# Patient Record
Sex: Female | Born: 1950
Health system: Southern US, Community
[De-identification: ages and names within clinical notes are randomized; demographics above are authoritative.]

## PROBLEM LIST (undated history)

## (undated) DIAGNOSIS — D649 Anemia, unspecified: Secondary | ICD-10-CM

## (undated) DIAGNOSIS — F419 Anxiety disorder, unspecified: Secondary | ICD-10-CM

## (undated) DIAGNOSIS — C801 Malignant (primary) neoplasm, unspecified: Secondary | ICD-10-CM

## (undated) DIAGNOSIS — M1990 Unspecified osteoarthritis, unspecified site: Secondary | ICD-10-CM

## (undated) DIAGNOSIS — K219 Gastro-esophageal reflux disease without esophagitis: Secondary | ICD-10-CM

## (undated) DIAGNOSIS — Z8489 Family history of other specified conditions: Secondary | ICD-10-CM

## (undated) DIAGNOSIS — J189 Pneumonia, unspecified organism: Secondary | ICD-10-CM

## (undated) HISTORY — PX: COLONOSCOPY: SHX174

## (undated) HISTORY — PX: TONSILLECTOMY: SUR1361

## (undated) HISTORY — PX: CHOLECYSTECTOMY: SHX55

## (undated) HISTORY — PX: MOUTH SURGERY: SHX715

## (undated) HISTORY — PX: OTHER SURGICAL HISTORY: SHX169

## (undated) HISTORY — PX: APPENDECTOMY: SHX54

## (undated) HISTORY — PX: TUBAL LIGATION: SHX77

---

## 1998-05-20 ENCOUNTER — Other Ambulatory Visit: Admission: RE | Admit: 1998-05-20 | Discharge: 1998-05-20 | Payer: Self-pay | Admitting: Obstetrics and Gynecology

## 1999-11-10 ENCOUNTER — Other Ambulatory Visit: Admission: RE | Admit: 1999-11-10 | Discharge: 1999-11-10 | Payer: Self-pay | Admitting: Obstetrics and Gynecology

## 2000-12-15 ENCOUNTER — Other Ambulatory Visit: Admission: RE | Admit: 2000-12-15 | Discharge: 2000-12-15 | Payer: Self-pay | Admitting: Obstetrics and Gynecology

## 2001-12-19 ENCOUNTER — Other Ambulatory Visit: Admission: RE | Admit: 2001-12-19 | Discharge: 2001-12-19 | Payer: Self-pay | Admitting: Obstetrics and Gynecology

## 2003-01-18 ENCOUNTER — Other Ambulatory Visit: Admission: RE | Admit: 2003-01-18 | Discharge: 2003-01-18 | Payer: Self-pay | Admitting: Obstetrics and Gynecology

## 2004-01-31 ENCOUNTER — Other Ambulatory Visit: Admission: RE | Admit: 2004-01-31 | Discharge: 2004-01-31 | Payer: Self-pay | Admitting: Obstetrics and Gynecology

## 2005-02-18 ENCOUNTER — Other Ambulatory Visit: Admission: RE | Admit: 2005-02-18 | Discharge: 2005-02-18 | Payer: Self-pay | Admitting: Obstetrics and Gynecology

## 2006-03-16 ENCOUNTER — Ambulatory Visit (HOSPITAL_COMMUNITY): Admission: RE | Admit: 2006-03-16 | Discharge: 2006-03-16 | Payer: Self-pay | Admitting: General Surgery

## 2011-07-01 ENCOUNTER — Other Ambulatory Visit: Payer: Self-pay | Admitting: Obstetrics and Gynecology

## 2011-09-29 ENCOUNTER — Other Ambulatory Visit: Payer: Self-pay | Admitting: Dermatology

## 2012-07-18 ENCOUNTER — Other Ambulatory Visit: Payer: Self-pay | Admitting: Physician Assistant

## 2012-08-03 ENCOUNTER — Other Ambulatory Visit: Payer: Self-pay | Admitting: Physician Assistant

## 2013-02-21 ENCOUNTER — Other Ambulatory Visit: Payer: Self-pay | Admitting: Obstetrics and Gynecology

## 2013-06-20 ENCOUNTER — Other Ambulatory Visit: Payer: Self-pay | Admitting: Physician Assistant

## 2014-03-14 ENCOUNTER — Other Ambulatory Visit: Payer: Self-pay | Admitting: Obstetrics and Gynecology

## 2014-03-15 LAB — CYTOLOGY - PAP

## 2014-03-28 ENCOUNTER — Other Ambulatory Visit: Payer: Self-pay | Admitting: Physician Assistant

## 2014-10-10 ENCOUNTER — Other Ambulatory Visit: Payer: Self-pay | Admitting: Physician Assistant

## 2015-08-14 ENCOUNTER — Other Ambulatory Visit: Payer: Self-pay | Admitting: Obstetrics and Gynecology

## 2015-08-15 LAB — CYTOLOGY - PAP

## 2018-06-26 ENCOUNTER — Encounter (HOSPITAL_COMMUNITY): Payer: Self-pay | Admitting: *Deleted

## 2018-06-26 ENCOUNTER — Other Ambulatory Visit: Payer: Self-pay

## 2018-06-26 NOTE — Progress Notes (Signed)
Spoke with pt for pre-op call. Pt denies cardiac history, HTN or Diabetes. 

## 2018-06-27 ENCOUNTER — Observation Stay (HOSPITAL_COMMUNITY)
Admission: RE | Admit: 2018-06-27 | Discharge: 2018-06-28 | Disposition: A | Discharge status: Home / Self Care | Payer: Medicare Other | Attending: Orthopedic Surgery | Admitting: Orthopedic Surgery

## 2018-06-27 ENCOUNTER — Encounter (HOSPITAL_COMMUNITY): Payer: Self-pay | Admitting: *Deleted

## 2018-06-27 ENCOUNTER — Ambulatory Visit (HOSPITAL_COMMUNITY): Payer: Medicare Other | Admitting: Certified Registered"

## 2018-06-27 ENCOUNTER — Encounter (HOSPITAL_COMMUNITY)
Admission: RE | Disposition: A | Discharge status: Home / Self Care | Payer: Self-pay | Source: Home / Self Care | Attending: Orthopedic Surgery

## 2018-06-27 DIAGNOSIS — Z91040 Latex allergy status: Secondary | ICD-10-CM | POA: Diagnosis not present

## 2018-06-27 DIAGNOSIS — Z87891 Personal history of nicotine dependence: Secondary | ICD-10-CM | POA: Insufficient documentation

## 2018-06-27 DIAGNOSIS — Z880 Allergy status to penicillin: Secondary | ICD-10-CM | POA: Diagnosis not present

## 2018-06-27 DIAGNOSIS — F419 Anxiety disorder, unspecified: Secondary | ICD-10-CM | POA: Diagnosis not present

## 2018-06-27 DIAGNOSIS — M1990 Unspecified osteoarthritis, unspecified site: Secondary | ICD-10-CM | POA: Diagnosis not present

## 2018-06-27 DIAGNOSIS — Z791 Long term (current) use of non-steroidal anti-inflammatories (NSAID): Secondary | ICD-10-CM | POA: Insufficient documentation

## 2018-06-27 DIAGNOSIS — Z85828 Personal history of other malignant neoplasm of skin: Secondary | ICD-10-CM | POA: Insufficient documentation

## 2018-06-27 DIAGNOSIS — K219 Gastro-esophageal reflux disease without esophagitis: Secondary | ICD-10-CM | POA: Diagnosis not present

## 2018-06-27 DIAGNOSIS — S52571A Other intraarticular fracture of lower end of right radius, initial encounter for closed fracture: Secondary | ICD-10-CM | POA: Diagnosis present

## 2018-06-27 DIAGNOSIS — X58XXXA Exposure to other specified factors, initial encounter: Secondary | ICD-10-CM | POA: Diagnosis not present

## 2018-06-27 DIAGNOSIS — Z9049 Acquired absence of other specified parts of digestive tract: Secondary | ICD-10-CM | POA: Insufficient documentation

## 2018-06-27 DIAGNOSIS — S52501A Unspecified fracture of the lower end of right radius, initial encounter for closed fracture: Secondary | ICD-10-CM | POA: Diagnosis present

## 2018-06-27 DIAGNOSIS — Z882 Allergy status to sulfonamides status: Secondary | ICD-10-CM | POA: Diagnosis not present

## 2018-06-27 DIAGNOSIS — Z79899 Other long term (current) drug therapy: Secondary | ICD-10-CM | POA: Insufficient documentation

## 2018-06-27 HISTORY — DX: Anemia, unspecified: D64.9

## 2018-06-27 HISTORY — DX: Pneumonia, unspecified organism: J18.9

## 2018-06-27 HISTORY — DX: Anxiety disorder, unspecified: F41.9

## 2018-06-27 HISTORY — DX: Malignant (primary) neoplasm, unspecified: C80.1

## 2018-06-27 HISTORY — DX: Unspecified osteoarthritis, unspecified site: M19.90

## 2018-06-27 HISTORY — PX: ORIF WRIST FRACTURE: SHX2133

## 2018-06-27 HISTORY — DX: Gastro-esophageal reflux disease without esophagitis: K21.9

## 2018-06-27 LAB — CBC
HCT: 37.5 % (ref 36.0–46.0)
Hemoglobin: 12.4 g/dL (ref 12.0–15.0)
MCH: 30.9 pg (ref 26.0–34.0)
MCHC: 33.1 g/dL (ref 30.0–36.0)
MCV: 93.5 fL (ref 80.0–100.0)
NRBC: 0 % (ref 0.0–0.2)
PLATELETS: 353 10*3/uL (ref 150–400)
RBC: 4.01 MIL/uL (ref 3.87–5.11)
RDW: 12.1 % (ref 11.5–15.5)
WBC: 9.1 10*3/uL (ref 4.0–10.5)

## 2018-06-27 SURGERY — OPEN REDUCTION INTERNAL FIXATION (ORIF) WRIST FRACTURE
Anesthesia: General | Laterality: Right

## 2018-06-27 MED ORDER — LIDOCAINE 2% (20 MG/ML) 5 ML SYRINGE
INTRAMUSCULAR | Status: AC
  Filled 2018-06-27: qty 5

## 2018-06-27 MED ORDER — ONDANSETRON HCL 4 MG PO TABS: 4.0000 mg | ORAL_TABLET | Freq: Four times a day (QID) | ORAL | Status: DC | PRN

## 2018-06-27 MED ORDER — MIDAZOLAM HCL 2 MG/2ML IJ SOLN
INTRAMUSCULAR | Status: AC
  Filled 2018-06-27: qty 2

## 2018-06-27 MED ORDER — ROPIVACAINE HCL 5 MG/ML IJ SOLN
INTRAMUSCULAR | Status: DC | PRN
  Administered 2018-06-27: 15 mL via EPIDURAL

## 2018-06-27 MED ORDER — ONDANSETRON HCL 4 MG/2ML IJ SOLN
INTRAMUSCULAR | Status: AC
  Filled 2018-06-27: qty 2

## 2018-06-27 MED ORDER — LIDOCAINE HCL (CARDIAC) PF 100 MG/5ML IV SOSY
PREFILLED_SYRINGE | INTRAVENOUS | Status: DC | PRN
  Administered 2018-06-27: 80 mg via INTRAVENOUS

## 2018-06-27 MED ORDER — MIDAZOLAM HCL 2 MG/2ML IJ SOLN
INTRAMUSCULAR | Status: AC
  Administered 2018-06-27: 2 mg
  Filled 2018-06-27: qty 2

## 2018-06-27 MED ORDER — OXYCODONE HCL 5 MG PO TABS
5.0000 mg | ORAL_TABLET | ORAL | Status: DC | PRN
  Administered 2018-06-28 (×2): 10 mg via ORAL
  Filled 2018-06-27 (×2): qty 2

## 2018-06-27 MED ORDER — FENTANYL CITRATE (PF) 100 MCG/2ML IJ SOLN
INTRAMUSCULAR | Status: AC
  Administered 2018-06-27: 50 ug
  Filled 2018-06-27: qty 2

## 2018-06-27 MED ORDER — DEXAMETHASONE SODIUM PHOSPHATE 10 MG/ML IJ SOLN
INTRAMUSCULAR | Status: DC | PRN
  Administered 2018-06-27: 5 mg via INTRAVENOUS

## 2018-06-27 MED ORDER — CEFAZOLIN SODIUM-DEXTROSE 1-4 GM/50ML-% IV SOLN
1.0000 g | INTRAVENOUS | Status: AC
  Administered 2018-06-27: 1 g via INTRAVENOUS
  Filled 2018-06-27: qty 50

## 2018-06-27 MED ORDER — DEXAMETHASONE SODIUM PHOSPHATE 10 MG/ML IJ SOLN
INTRAMUSCULAR | Status: AC
  Filled 2018-06-27: qty 1

## 2018-06-27 MED ORDER — HYDROMORPHONE HCL 1 MG/ML IJ SOLN: 0.5000 mg | INTRAMUSCULAR | Status: DC | PRN

## 2018-06-27 MED ORDER — METHOCARBAMOL 500 MG PO TABS
500.0000 mg | ORAL_TABLET | Freq: Four times a day (QID) | ORAL | Status: DC | PRN
  Administered 2018-06-28: 500 mg via ORAL
  Filled 2018-06-27: qty 1

## 2018-06-27 MED ORDER — ALPRAZOLAM 0.5 MG PO TABS: 0.5000 mg | ORAL_TABLET | Freq: Four times a day (QID) | ORAL | Status: DC | PRN

## 2018-06-27 MED ORDER — VITAMIN C 500 MG PO TABS
1000.0000 mg | ORAL_TABLET | Freq: Every day | ORAL | Status: DC
  Administered 2018-06-27 – 2018-06-28 (×2): 1000 mg via ORAL
  Filled 2018-06-27 (×2): qty 2

## 2018-06-27 MED ORDER — DOCUSATE SODIUM 100 MG PO CAPS
100.0000 mg | ORAL_CAPSULE | Freq: Two times a day (BID) | ORAL | Status: DC
  Administered 2018-06-27 – 2018-06-28 (×2): 100 mg via ORAL
  Filled 2018-06-27 (×2): qty 1

## 2018-06-27 MED ORDER — PHENYLEPHRINE 40 MCG/ML (10ML) SYRINGE FOR IV PUSH (FOR BLOOD PRESSURE SUPPORT)
PREFILLED_SYRINGE | INTRAVENOUS | Status: AC
  Filled 2018-06-27: qty 10

## 2018-06-27 MED ORDER — FAMOTIDINE 20 MG PO TABS: 20.0000 mg | ORAL_TABLET | Freq: Two times a day (BID) | ORAL | Status: DC | PRN

## 2018-06-27 MED ORDER — FENTANYL CITRATE (PF) 100 MCG/2ML IJ SOLN: 25.0000 ug | INTRAMUSCULAR | Status: DC | PRN

## 2018-06-27 MED ORDER — PROPOFOL 10 MG/ML IV BOLUS
INTRAVENOUS | Status: AC
  Filled 2018-06-27: qty 20

## 2018-06-27 MED ORDER — LACTATED RINGERS IV SOLN
INTRAVENOUS | Status: DC
  Administered 2018-06-27 (×2): via INTRAVENOUS

## 2018-06-27 MED ORDER — ONDANSETRON HCL 4 MG/2ML IJ SOLN
INTRAMUSCULAR | Status: DC | PRN
  Administered 2018-06-27: 4 mg via INTRAVENOUS

## 2018-06-27 MED ORDER — MIDAZOLAM HCL 5 MG/5ML IJ SOLN
INTRAMUSCULAR | Status: DC | PRN
  Administered 2018-06-27: 2 mg via INTRAVENOUS

## 2018-06-27 MED ORDER — BUPIVACAINE HCL (PF) 0.25 % IJ SOLN
INTRAMUSCULAR | Status: AC
  Filled 2018-06-27: qty 30

## 2018-06-27 MED ORDER — CEFAZOLIN SODIUM-DEXTROSE 2-4 GM/100ML-% IV SOLN
INTRAVENOUS | Status: AC
  Filled 2018-06-27: qty 100

## 2018-06-27 MED ORDER — CEFAZOLIN SODIUM-DEXTROSE 1-4 GM/50ML-% IV SOLN
1.0000 g | Freq: Three times a day (TID) | INTRAVENOUS | Status: DC
  Administered 2018-06-28 (×2): 1 g via INTRAVENOUS
  Filled 2018-06-27 (×3): qty 50

## 2018-06-27 MED ORDER — FENTANYL CITRATE (PF) 250 MCG/5ML IJ SOLN
INTRAMUSCULAR | Status: DC | PRN
  Administered 2018-06-27: 50 ug via INTRAVENOUS

## 2018-06-27 MED ORDER — PROMETHAZINE HCL 12.5 MG RE SUPP
12.5000 mg | Freq: Four times a day (QID) | RECTAL | Status: DC | PRN
  Filled 2018-06-27: qty 1

## 2018-06-27 MED ORDER — ONDANSETRON HCL 4 MG/2ML IJ SOLN: 4.0000 mg | Freq: Four times a day (QID) | INTRAMUSCULAR | Status: DC | PRN

## 2018-06-27 MED ORDER — PHENYLEPHRINE 40 MCG/ML (10ML) SYRINGE FOR IV PUSH (FOR BLOOD PRESSURE SUPPORT)
PREFILLED_SYRINGE | INTRAVENOUS | Status: DC | PRN
  Administered 2018-06-27 (×2): 80 ug via INTRAVENOUS
  Administered 2018-06-27: 60 ug via INTRAVENOUS

## 2018-06-27 MED ORDER — METHOCARBAMOL 1000 MG/10ML IJ SOLN
500.0000 mg | Freq: Four times a day (QID) | INTRAVENOUS | Status: DC | PRN
  Filled 2018-06-27: qty 5

## 2018-06-27 MED ORDER — CEFAZOLIN SODIUM-DEXTROSE 2-4 GM/100ML-% IV SOLN
2.0000 g | Freq: Once | INTRAVENOUS | Status: AC
  Administered 2018-06-27: 2 g via INTRAVENOUS

## 2018-06-27 MED ORDER — 0.9 % SODIUM CHLORIDE (POUR BTL) OPTIME
TOPICAL | Status: DC | PRN
  Administered 2018-06-27: 1000 mL

## 2018-06-27 MED ORDER — FENTANYL CITRATE (PF) 250 MCG/5ML IJ SOLN
INTRAMUSCULAR | Status: AC
Start: 2018-06-27 — End: ?
  Filled 2018-06-27: qty 5

## 2018-06-27 MED ORDER — PROPOFOL 10 MG/ML IV BOLUS
INTRAVENOUS | Status: DC | PRN
  Administered 2018-06-27: 140 mg via INTRAVENOUS

## 2018-06-27 MED ORDER — ONDANSETRON HCL 4 MG/2ML IJ SOLN: 4.0000 mg | Freq: Once | INTRAMUSCULAR | Status: DC | PRN

## 2018-06-27 SURGICAL SUPPLY — 63 items
BANDAGE ACE 3X5.8 VEL STRL LF (GAUZE/BANDAGES/DRESSINGS) ×3 IMPLANT
BANDAGE ACE 4X5 VEL STRL LF (GAUZE/BANDAGES/DRESSINGS) ×3 IMPLANT
BIT DRILL 2.2 SS TIBIAL (BIT) ×2 IMPLANT
BLADE CLIPPER SURG (BLADE) IMPLANT
BNDG CMPR 9X4 STRL LF SNTH (GAUZE/BANDAGES/DRESSINGS) ×1
BNDG ESMARK 4X9 LF (GAUZE/BANDAGES/DRESSINGS) ×3 IMPLANT
BNDG GAUZE ELAST 4 BULKY (GAUZE/BANDAGES/DRESSINGS) ×5 IMPLANT
CANISTER SUCT 3000ML PPV (MISCELLANEOUS) ×3 IMPLANT
CORDS BIPOLAR (ELECTRODE) ×3 IMPLANT
COVER SURGICAL LIGHT HANDLE (MISCELLANEOUS) ×3 IMPLANT
COVER WAND RF STERILE (DRAPES) ×3 IMPLANT
CUFF TOURNIQUET SINGLE 18IN (TOURNIQUET CUFF) ×3 IMPLANT
CUFF TOURNIQUET SINGLE 24IN (TOURNIQUET CUFF) IMPLANT
DRAIN TLS ROUND 10FR (DRAIN) IMPLANT
DRAPE OEC MINIVIEW 54X84 (DRAPES) IMPLANT
DRAPE SURG 17X23 STRL (DRAPES) ×3 IMPLANT
DRSG ADAPTIC 3X8 NADH LF (GAUZE/BANDAGES/DRESSINGS) ×2 IMPLANT
GAUZE SPONGE 4X4 12PLY STRL (GAUZE/BANDAGES/DRESSINGS) ×3 IMPLANT
GAUZE XEROFORM 1X8 LF (GAUZE/BANDAGES/DRESSINGS) ×3 IMPLANT
GAUZE XEROFORM 5X9 LF (GAUZE/BANDAGES/DRESSINGS) ×2 IMPLANT
GOWN STRL REUS W/ TWL LRG LVL3 (GOWN DISPOSABLE) ×1 IMPLANT
GOWN STRL REUS W/ TWL XL LVL3 (GOWN DISPOSABLE) ×2 IMPLANT
GOWN STRL REUS W/TWL LRG LVL3 (GOWN DISPOSABLE) ×3
GOWN STRL REUS W/TWL XL LVL3 (GOWN DISPOSABLE) ×6
KIT BASIN OR (CUSTOM PROCEDURE TRAY) ×3 IMPLANT
KIT TURNOVER KIT B (KITS) ×3 IMPLANT
LOOP VESSEL MAXI BLUE (MISCELLANEOUS) IMPLANT
MANIFOLD NEPTUNE II (INSTRUMENTS) ×3 IMPLANT
NEEDLE 22X1 1/2 (OR ONLY) (NEEDLE) IMPLANT
NS IRRIG 1000ML POUR BTL (IV SOLUTION) ×3 IMPLANT
PACK ORTHO EXTREMITY (CUSTOM PROCEDURE TRAY) ×3 IMPLANT
PAD ARMBOARD 7.5X6 YLW CONV (MISCELLANEOUS) ×6 IMPLANT
PAD CAST 3X4 CTTN HI CHSV (CAST SUPPLIES) ×1 IMPLANT
PAD CAST 4YDX4 CTTN HI CHSV (CAST SUPPLIES) ×1 IMPLANT
PADDING CAST COTTON 3X4 STRL (CAST SUPPLIES) ×3
PADDING CAST COTTON 4X4 STRL (CAST SUPPLIES) ×3
PEG LOCKING SMOOTH 2.2X20 (Screw) ×6 IMPLANT
PEG LOCKING SMOOTH 2.2X22 (Screw) ×4 IMPLANT
PLATE MEDIUM DVR RIGHT (Plate) ×2 IMPLANT
PUTTY DBM STAGRAFT PLUS 5CC (Putty) ×2 IMPLANT
SCREW LOCK 14X2.7X 3 LD TPR (Screw) IMPLANT
SCREW LOCK 16X2.7X 3 LD TPR (Screw) IMPLANT
SCREW LOCK 20X2.7X 3 LD TPR (Screw) IMPLANT
SCREW LOCKING 2.7X13MM (Screw) ×2 IMPLANT
SCREW LOCKING 2.7X14 (Screw) ×6 IMPLANT
SCREW LOCKING 2.7X15MM (Screw) ×4 IMPLANT
SCREW LOCKING 2.7X16 (Screw) ×3 IMPLANT
SCREW LOCKING 2.7X20MM (Screw) ×3 IMPLANT
SCREW MULTI DIRECTIONAL 2.7X20 (Screw) ×2 IMPLANT
SCRUB BETADINE 4OZ XXX (MISCELLANEOUS) ×3 IMPLANT
SOL PREP POV-IOD 4OZ 10% (MISCELLANEOUS) ×3 IMPLANT
SPONGE LAP 4X18 RFD (DISPOSABLE) IMPLANT
SUT MNCRL AB 4-0 PS2 18 (SUTURE) ×3 IMPLANT
SUT PROLENE 3 0 PS 2 (SUTURE) IMPLANT
SUT VIC AB 3-0 FS2 27 (SUTURE) IMPLANT
SYR CONTROL 10ML LL (SYRINGE) IMPLANT
SYSTEM CHEST DRAIN TLS 7FR (DRAIN) ×2 IMPLANT
TOWEL OR 17X24 6PK STRL BLUE (TOWEL DISPOSABLE) ×3 IMPLANT
TOWEL OR 17X26 10 PK STRL BLUE (TOWEL DISPOSABLE) ×3 IMPLANT
TUBE CONNECTING 12'X1/4 (SUCTIONS) ×1
TUBE CONNECTING 12X1/4 (SUCTIONS) ×2 IMPLANT
TUBE EVACUATION TLS (MISCELLANEOUS) ×3 IMPLANT
WATER STERILE IRR 1000ML POUR (IV SOLUTION) ×3 IMPLANT

## 2018-06-27 NOTE — Transfer of Care (Signed)
Immediate Anesthesia Transfer of Care Note  Patient: Gloria Wise  Procedure(s) Performed: OPEN REDUCTION INTERNAL FIXATION (ORIF) WRIST FRACTURE (Right )  Patient Location: PACU  Anesthesia Type:GA combined with regional for post-op pain  Level of Consciousness: awake, alert  and oriented  Airway & Oxygen Therapy: Patient Spontanous Breathing and Patient connected to face mask oxygen  Post-op Assessment: Report given to RN and Post -op Vital signs reviewed and stable  Post vital signs: Reviewed and stable  Last Vitals:  Vitals Value Taken Time  BP 119/64 06/27/2018  6:47 PM  Temp    Pulse 96 06/27/2018  6:50 PM  Resp 17 06/27/2018  6:50 PM  SpO2 99 % 06/27/2018  6:50 PM  Vitals shown include unvalidated device data.  Last Pain:  Vitals:   06/27/18 1411  TempSrc: Oral         Complications: No apparent anesthesia complications

## 2018-06-27 NOTE — Anesthesia Procedure Notes (Signed)
Procedure Name: LMA Insertion Date/Time: 06/27/2018 5:05 PM Performed by: Teressa Lower., CRNA Pre-anesthesia Checklist: Patient identified, Emergency Drugs available, Suction available and Patient being monitored Patient Re-evaluated:Patient Re-evaluated prior to induction Oxygen Delivery Method: Circle system utilized Preoxygenation: Pre-oxygenation with 100% oxygen Induction Type: IV induction LMA: LMA inserted LMA Size: 4.0 Number of attempts: 1 Placement Confirmation: positive ETCO2 and breath sounds checked- equal and bilateral Tube secured with: Tape Dental Injury: Teeth and Oropharynx as per pre-operative assessment

## 2018-06-27 NOTE — Anesthesia Preprocedure Evaluation (Addendum)
Anesthesia Evaluation  Patient identified by MRN, date of birth, ID band Patient awake    Reviewed: Allergy & Precautions, NPO status , Patient's Chart, lab work & pertinent test results  Airway Mallampati: II  TM Distance: <3 FB Neck ROM: Full    Dental  (+) Teeth Intact, Dental Advisory Given   Pulmonary former smoker,    Pulmonary exam normal breath sounds clear to auscultation       Cardiovascular Exercise Tolerance: Good negative cardio ROS Normal cardiovascular exam Rhythm:Regular Rate:Normal     Neuro/Psych PSYCHIATRIC DISORDERS Anxiety negative neurological ROS     GI/Hepatic Neg liver ROS, GERD  Medicated,  Endo/Other  negative endocrine ROS  Renal/GU negative Renal ROS     Musculoskeletal  (+) Arthritis , Right wrist fracture   Abdominal   Peds  Hematology negative hematology ROS (+)   Anesthesia Other Findings Day of surgery medications reviewed with the patient.  Reproductive/Obstetrics                            Anesthesia Physical Anesthesia Plan  ASA: II  Anesthesia Plan: General   Post-op Pain Management:  Regional for Post-op pain   Induction: Intravenous  PONV Risk Score and Plan: 3 and Midazolam, Dexamethasone and Ondansetron  Airway Management Planned: LMA  Additional Equipment:   Intra-op Plan:   Post-operative Plan: Extubation in OR  Informed Consent: I have reviewed the patients History and Physical, chart, labs and discussed the procedure including the risks, benefits and alternatives for the proposed anesthesia with the patient or authorized representative who has indicated his/her understanding and acceptance.     Dental advisory given  Plan Discussed with: CRNA  Anesthesia Plan Comments:         Anesthesia Quick Evaluation

## 2018-06-27 NOTE — Op Note (Signed)
See dictation#[id number] Status post ORIF distal radius fracture right upper extremity  Abenezer Odonell MD

## 2018-06-27 NOTE — Anesthesia Procedure Notes (Signed)
Anesthesia Regional Block: Supraclavicular block   Pre-Anesthetic Checklist: ,, timeout performed, Correct Patient, Correct Site, Correct Laterality, Correct Procedure, Correct Position, site marked, Risks and benefits discussed,  Surgical consent,  Pre-op evaluation,  At surgeon's request and post-op pain management  Laterality: Right  Prep: chloraprep       Needles:  Injection technique: Single-shot  Needle Type: Echogenic Needle     Needle Length: 9cm  Needle Gauge: 21     Additional Needles:   Procedures:,,,, ultrasound used (permanent image in chart),,,,  Narrative:  Start time: 06/27/2018 4:21 PM End time: 06/27/2018 4:31 PM Injection made incrementally with aspirations every 5 mL.  Performed by: Personally  Anesthesiologist: Catalina Gravel, MD  Additional Notes: No pain on injection. No increased resistance to injection. Injection made in 5cc increments.  Good needle visualization.  Patient tolerated procedure well.

## 2018-06-27 NOTE — Op Note (Signed)
NAMEDARLA, MCDONALD MEDICAL RECORD QA:[id number] ACCOUNT [id number] DATE OF BIRTH:[birth year] FACILITY: [facility] LOCATION: [facility] PHYSICIAN:Shakim Faith M. Celest Reitz, MD  OPERATIVE REPORT  DATE OF PROCEDURE:  06/27/2018  PREOPERATIVE DIAGNOSIS:  Comminuted complex intraarticular distal radius fracture, right upper extremity, with marked metaphyseal comminution.  POSTOPERATIVE DIAGNOSIS:  Comminuted complex intraarticular distal radius fracture, right upper extremity, with marked metaphyseal comminution.  PROCEDURE: 1.  Open reduction internal fixation right comminuted complex distal radius fracture with 5 mL of StaGraft allograft and a DVR plate and screw construct.  This was a standard DVR plate.  Slightly extended in length. 2.  AP, lateral and oblique x-rays performed exam interpreted by myself, right wrist.  SURGEON:  Roseanne Kaufman, MD  ASSISTANT:  None.  COMPLICATIONS:  None.  ANESTHESIA:  Preoperative block with general.  TOURNIQUET TIME:  Less than an hour.  INDICATIONS:  The patient is a pleasant female who presents with above-mentioned diagnosis.  I have counseled her in regards to risks and benefits of surgery and she desires to proceed with the above-mentioned operative intervention.  All questions have  been encouraged and answered preoperatively.  The patient has marked comminution.  She understands risks and benefit profile and other issues germane to her predicament.  DESCRIPTION OF PROCEDURE:  The patient was seen by myself and anesthesia and block was placed.  Preoperative anesthesia was discussed and she underwent a general anesthetic.  She was prepped and draped with Hibiclens scrub followed by a 10-minute  surgical Betadine scrub and paint all performed by myself to prevent re-displacement or other problems.  Once this was complete, the patient then underwent a very careful and cautious approach to the extremity with timeout, followed by a volar radial  incision.  A  volar radial incision was created.  The FCR was released palmarly and dorsally followed by retraction of the carpal canal contents ulnarly and incision of the pronator quadratus.  I then packed StaGraft bone graft and reassembled the radius.   A slightly extended standard DVR right plate was placed.  This was placed without difficulty and allowed for re-creation of volar tilt, radial height and radial inclination to my satisfaction distal radioulnar joint looked excellent.  Stability looked  excellent.  Live fluoroscopy and four view stress radiography was accomplished which looked excellent.  Following this, the patient then underwent a very careful and cautious irrigation approach followed by pronator closure with Vicryl followed by  closure of the wound over a TLS drain with the tourniquet deflated and hemostasis secured with 4-0 and 3-0 Prolene.  The patient tolerated this well.  There were no complicating features.  All sponge, needle and instrument counts reported as correct.  There were no complications.  We will use a standard rehab protocol for her.  She will be spending the night for IV antibiotics, general postoperative observation and pain management.  Once  again ORIF of right distal radius without complications.  TN/NUANCE  D:06/27/2018 T:06/27/2018 JOB:[id number]

## 2018-06-27 NOTE — H&P (Signed)
Gloria Wise is an 68 y.o. female.   Chief Complaint: Right comminuted wrist fracture HPI: Patient presents for ORIF right wrist fracture.  Patient presents for evaluation and treatment of the of their upper extremity predicament. The patient denies neck, back, chest or  abdominal pain. The patient notes that they have no lower extremity problems. The patients primary complaint is noted. We are planning surgical care pathway for the upper extremity.  Past Medical History:  Diagnosis Date  . Anemia    after first child  . Anxiety   . Arthritis   . Cancer ([place])    Basal cell on face  . GERD (gastroesophageal reflux disease)   . Pneumonia     Past Surgical History:  Procedure Laterality Date  . APPENDECTOMY    . CHOLECYSTECTOMY    . COLONOSCOPY    . hemorroid banding    . MOUTH SURGERY    . TONSILLECTOMY    . TUBAL LIGATION      History reviewed. No pertinent family history. Social History:  reports that she has quit smoking. She has never used smokeless tobacco. She reports current alcohol use. She reports that she does not use drugs.  Allergies:  Allergies  Allergen Reactions  . Latex Other (See Comments)    Skin irritation   . Penicillins Diarrhea    Upset stomach   . Sulfa Antibiotics Rash    All over    Medications Prior to Admission  Medication Sig Dispense Refill  . cholecalciferol (VITAMIN D3) 25 MCG (1000 UT) tablet Take 1,000 Units by mouth daily.    Marland Kitchen HYDROcodone-acetaminophen (NORCO/VICODIN) 5-325 MG tablet Take 1 tablet by mouth every 6 (six) hours as needed for moderate pain.    Marland Kitchen ibuprofen (ADVIL,MOTRIN) 200 MG tablet Take 400 mg by mouth every 8 (eight) hours as needed (pain).    Marland Kitchen loratadine (CLARITIN) 10 MG tablet Take 10 mg by mouth daily as needed for allergies.    . pseudoephedrine (SUDAFED) 30 MG tablet Take 30 mg by mouth every 4 (four) hours as needed for congestion.    . Psyllium Husk POWD Take 5 mLs by mouth daily.      Results for orders  placed or performed during the hospital encounter of 06/27/18 (from the past 48 hour(s))  CBC     Status: None   Collection Time: 06/27/18  2:34 PM  Result Value Ref Range   WBC 9.1 4.0 - 10.5 K/uL   RBC 4.01 3.87 - 5.11 MIL/uL   Hemoglobin 12.4 12.0 - 15.0 g/dL   HCT 37.5 36.0 - 46.0 %   MCV 93.5 80.0 - 100.0 fL   MCH 30.9 26.0 - 34.0 pg   MCHC 33.1 30.0 - 36.0 g/dL   RDW 12.1 11.5 - 15.5 %   Platelets 353 150 - 400 K/uL   nRBC 0.0 0.0 - 0.2 %    Comment: Performed at [facility], [place] [address] [place], [place] [address]   No results found.  Review of Systems  Respiratory: Negative.   Cardiovascular: Negative.   Gastrointestinal: Negative.   Genitourinary: Negative.     Blood pressure 135/73, pulse 84, temperature 99.2 F (37.3 C), temperature source Oral, resp. rate 18, height 5' 7.5" (1.715 m), weight 78.9 kg, SpO2 97 %. Physical Exam  Comminuted complex and articular right distal radius fracture with displacement.  Patient will proceed with ORIF  The patient is alert and oriented in no acute distress. The patient complains of  pain in the affected upper extremity.  The patient is noted to have a normal HEENT exam. Lung fields show equal chest expansion and no shortness of breath. Abdomen exam is nontender without distention. Lower extremity examination does not show any fracture dislocation or blood clot symptoms. Pelvis is stable and the neck and back are stable and nontender. Assessment/Plan We are planning surgery for your upper extremity. The risk and benefits of surgery to include risk of bleeding, infection, anesthesia,  damage to normal structures and failure of the surgery to accomplish its intended goals of relieving symptoms and restoring function have been discussed in detail. With this in mind we plan to proceed. I have specifically discussed with the patient the pre-and postoperative regime and the dos and don'ts and risk and benefits in great detail.  Risk and benefits of surgery also include risk of dystrophy(CRPS), chronic nerve pain, failure of the healing process to go onto completion and other inherent risks of surgery The relavent the pathophysiology of the disease/injury process, as well as the alternatives for treatment and postoperative course of action has been discussed in great detail with the patient who desires to proceed.  We will do everything in our power to help you (the patient) restore function to the upper extremity. It is a pleasure to see this patient today.   Willa Frater III, MD 06/27/2018, 6:46 PM

## 2018-06-28 ENCOUNTER — Other Ambulatory Visit: Payer: Self-pay

## 2018-06-28 DIAGNOSIS — S52571A Other intraarticular fracture of lower end of right radius, initial encounter for closed fracture: Secondary | ICD-10-CM | POA: Diagnosis not present

## 2018-06-28 NOTE — Progress Notes (Signed)
AVS given and reviewed with pt. Medications discussed and reviewed. All questions answered to satisfaction. Pt to be escorted off the unit via wheelchair by volunteer services.

## 2018-06-28 NOTE — Plan of Care (Signed)
  Problem: Clinical Measurements: Goal: Postoperative complications will be avoided or minimized Outcome: Progressing   Problem: Skin Integrity: Goal: Demonstration of wound healing without infection will improve Outcome: Progressing   

## 2018-06-28 NOTE — Progress Notes (Signed)
Patient ID: Gloria Wise, female   DOB: 1950/12/01, 68 y.o.   MRN: [id number] Stable on exam.  Patient is in good spirits.  Block is still in place.  The patient and I discussed all issues at length.  There is no neurovascular deficit.  We will await the block to dissipate.  Will discharge after lunch.  She will expect to have a significant degree of pain and we discussed this.  I will phone her in pain medicine to her pharmacy.  It is been a pleasure to see and treat her.  We will see her in 12 to 16 days.  All questions have been addressed.  Patient has been seen and examined. Patient has pain appropriate to his injury/process. Patient denies new complaints at this present time. I have discussed the care pathway with nursing staff. Patient is appropriate and alert.  We reviewed vital signs and intake output which are stable.  The upper extremity is neurovascularly intact. Refill is normal. There is no signs of compartment syndrome. There is no signs of dystrophy. There is normal sensation.  I have spent a  great deal of time discussing range of motion edema control and other techniques to decrease edema and promote flexion extension of the fingers. Patient understands the importance of elevation range of motion massage and other measures to lessen pain and prevent swelling.  We have also discussed immobilization to appropriate areas involved.  We have discussed with the patient shoulder range of motion to prevent adhesive capsulitis.  The remainder of the examination is normal today without complicating feature.  Drain was removed without difficulty  Patient will be discharged home. Will plan to see the patient back in the office as per discharge instructions (please see discharge instructions).  Patient had an uneventful hospital course. At the time of discharge patient is stable awake alert and oriented in no acute distress. Regular diet will be continued and has been tolerated. Patient will  notify should have problems occur. There is no signs of DVT infection or other complication at this juncture.  All questions have been incurred and answered.  Please see discharge med list  Legacy Carrender MD

## 2018-06-28 NOTE — Discharge Instructions (Signed)

## 2018-06-28 NOTE — Progress Notes (Signed)
PT Cancellation Note  Patient Details Name: Gloria Wise MRN: [id number] DOB: 06-Jun-1950   Cancelled Treatment:    Reason Eval/Treat Not Completed: PT screened, no needs identified, will sign off; attempted to see pt, OT in room and reports patient mobilizing well and no need for skilled PT at this time.  Patient related events of her injury and verbalized plans for slower movements and more careful thought to activities.  Will sign off as per pt/OT no needs.   Reginia Naas 06/28/2018, 9:58 AM  Magda Kiel, [place] [phone] 06/28/2018

## 2018-06-28 NOTE — Evaluation (Signed)
Occupational Therapy Evaluation and Discharge Patient Details Name: Gloria Wise MRN: [id number] DOB: [birth year] Today's Date: 06/28/2018    History of Present Illness Pt is a 68 y/o female s/p ORIF R wrist fracture. Pt has a PMH including Anemia, Anxiety, Arthritis, Cancer, and Pneumonia.   Clinical Impression   Pt seen for education of edema management, education with safety and compensatory strategies for dressing/bathing IADL. Pt able to perform transfers at independent level, ambulation without LOB or dizziness/light headedness. Pt required LOTS of reinforcement of education due to anxiety. Provided encouragement. Pt plans on largely wearing a bathrobe at home, but educated to dress RUE first, has a specialized bag for bathing and plans on having supervision. Education complete. OT to sign off at this time.     Follow Up Recommendations  Follow surgeon's recommendation for DC plan and follow-up therapies    Equipment Recommendations  None recommended by OT    Recommendations for Other Services       Precautions / Restrictions Precautions Required Braces or Orthoses: Splint/Cast Splint/Cast: R wrist Restrictions Weight Bearing Restrictions: Yes RUE Weight Bearing: Non weight bearing      Mobility Bed Mobility Overal bed mobility: Independent                Transfers Overall transfer level: Independent Equipment used: None                  Balance Overall balance assessment: No apparent balance deficits (not formally assessed)                                         ADL either performed or assessed with clinical judgement   ADL Overall ADL's : Modified independent                                       General ADL Comments: educated on sequencing for dressing, safety and compensatory strategies for bathing, smart clothing choices     Vision Baseline Vision/History: Wears glasses Wears Glasses: At all times Patient  Visual Report: No change from baseline       Perception     Praxis      Pertinent Vitals/Pain Pain Assessment: Faces Faces Pain Scale: Hurts a little bit Pain Location: R wrist Pain Descriptors / Indicators: Discomfort Pain Intervention(s): Monitored during session;Repositioned;Other (comment)(elevation, exercises,)     Hand Dominance Right   Extremity/Trunk Assessment Upper Extremity Assessment Upper Extremity Assessment: RUE deficits/detail RUE Deficits / Details: cast from wrist to elbow, elbow and shoulder ROM WFL RUE Sensation: decreased light touch(dull) RUE Coordination: decreased fine motor   Lower Extremity Assessment Lower Extremity Assessment: Overall WFL for tasks assessed   Cervical / Trunk Assessment Cervical / Trunk Assessment: Normal   Communication Communication Communication: No difficulties   Cognition Arousal/Alertness: Awake/alert Behavior During Therapy: Anxious;WFL for tasks assessed/performed Overall Cognitive Status: Within Functional Limits for tasks assessed                                     General Comments       Exercises Exercises: Other exercises Other Exercises Other Exercises: shoulder FF and abduction, horizontal adduction Other Exercises: digit flexion/extension as cast allows Other Exercises: thumb movement as cast  allows Other Exercises: elbow fexion/extension   Shoulder Instructions      Home Living Family/patient expects to be discharged to:: Private residence Living Arrangements: Alone Available Help at Discharge: Family;Friend(s);Available 24 hours/day Type of Home: House                       Home Equipment: Other (comment)(sling)          Prior Functioning/Environment Level of Independence: Independent        Comments: drives, assists with grandchildren, loves bridge        OT Problem List: Pain;Impaired UE functional use;Impaired sensation      OT Treatment/Interventions:       OT Goals(Current goals can be found in the care plan section) Acute Rehab OT Goals Patient Stated Goal: get back to playing bridge and watching grandkids OT Goal Formulation: With patient Time For Goal Achievement: 07/12/18 Potential to Achieve Goals: Good  OT Frequency:     Barriers to D/C:            Co-evaluation              AM-PAC OT "6 Clicks" Daily Activity     Outcome Measure Help from another person eating meals?: A Little Help from another person taking care of personal grooming?: A Little Help from another person toileting, which includes using toliet, bedpan, or urinal?: None Help from another person bathing (including washing, rinsing, drying)?: A Little Help from another person to put on and taking off regular upper body clothing?: A Little Help from another person to put on and taking off regular lower body clothing?: A Little 6 Click Score: 19   End of Session Equipment Utilized During Treatment: Gait belt Nurse Communication: Mobility status  Activity Tolerance: Patient tolerated treatment well Patient left: in chair;with call bell/phone within reach  OT Visit Diagnosis: Pain Pain - Right/Left: Right Pain - part of body: Arm                Time: 1155-2080 OT Time Calculation (min): 52 min Charges:  OT General Charges $OT Visit: 1 Visit OT Evaluation $OT Eval Low Complexity: [place] OTR/L Acute Rehabilitation Services Pager: [phone] Office: [place] 06/28/2018, 10:49 AM

## 2018-06-28 NOTE — Discharge Summary (Signed)
Physician Discharge Summary  Patient ID: Gloria Wise MRN: [id number] DOB/AGE: Sep 23, 1950 68 y.o.  Admit date: 06/27/2018 Discharge date:   Admission Diagnoses: Right wrist fracture Past Medical History:  Diagnosis Date  . Anemia    after first child  . Anxiety   . Arthritis   . Cancer ([place])    Basal cell on face  . GERD (gastroesophageal reflux disease)   . Pneumonia     Discharge Diagnoses:  Active Problems:   Closed fracture of right distal radius   Surgeries: Procedure(s): OPEN REDUCTION INTERNAL FIXATION (ORIF) WRIST FRACTURE on 06/27/2018    Consultants:   Discharged Condition: Improved  Hospital Course: REENA BORROMEO is an 68 y.o. female who was admitted 06/27/2018 with a chief complaint of No chief complaint on file. , and found to have a diagnosis of Right wrist fracture.  They were brought to the operating room on 06/27/2018 and underwent Procedure(s): OPEN REDUCTION INTERNAL FIXATION (ORIF) WRIST FRACTURE.    They were given perioperative antibiotics:  Anti-infectives (From admission, onward)   Start     Dose/Rate Route Frequency Ordered Stop   06/28/18 0400  ceFAZolin (ANCEF) IVPB 1 g/50 mL premix     1 g 100 mL/hr over 30 Minutes Intravenous Every 8 hours 06/27/18 1951     06/27/18 2000  ceFAZolin (ANCEF) IVPB 1 g/50 mL premix     1 g 100 mL/hr over 30 Minutes Intravenous NOW 06/27/18 1951 06/27/18 2253   06/27/18 1416  ceFAZolin (ANCEF) 2-4 GM/100ML-% IVPB    Note to Pharmacy:  Merryl Hacker   : cabinet override      06/27/18 1416 06/27/18 1710   06/27/18 1415  ceFAZolin (ANCEF) IVPB 2g/100 mL premix     2 g 200 mL/hr over 30 Minutes Intravenous  Once 06/27/18 1409 06/27/18 1710    .  They were given sequential compression devices, early ambulation, and Other (comment) for DVT prophylaxis.  Recent vital signs:  Patient Vitals for the past 24 hrs:  BP Temp Temp src Pulse Resp SpO2 Height Weight  06/28/18 0409 119/67 98.6 F (37 C) - 88 16 94 % - -   06/28/18 0006 112/62 98.6 F (37 C) Oral 88 18 94 % - -  06/27/18 1948 119/78 98.2 F (36.8 C) Oral 86 19 95 % - -  06/27/18 1936 - (!) 97 F (36.1 C) - 78 17 94 % - -  06/27/18 1932 133/69 - - - - - - -  06/27/18 1928 - - - 78 15 94 % - -  06/27/18 1917 126/70 - - - - - - -  06/27/18 1905 139/78 - - 91 16 98 % - -  06/27/18 1854 - - - 98 12 99 % - -  06/27/18 1851 - 97.8 F (36.6 C) - 97 - - - -  06/27/18 1847 119/64 - - - - - - -  06/27/18 1411 135/73 99.2 F (37.3 C) Oral 84 18 97 % 5' 7.5" (1.715 m) 78.9 kg  .  Recent laboratory studies: No results found.  Discharge Medications:   Allergies as of 06/28/2018      Reactions   Latex Other (See Comments)   Skin irritation    Penicillins Diarrhea   Upset stomach    Sulfa Antibiotics Rash   All over      Medication List    TAKE these medications   cholecalciferol 25 MCG (1000 UT) tablet Commonly known as:  VITAMIN D3 Take  1,000 Units by mouth daily.   HYDROcodone-acetaminophen 5-325 MG tablet Commonly known as:  NORCO/VICODIN Take 1 tablet by mouth every 6 (six) hours as needed for moderate pain.   ibuprofen 200 MG tablet Commonly known as:  ADVIL,MOTRIN Take 400 mg by mouth every 8 (eight) hours as needed (pain).   loratadine 10 MG tablet Commonly known as:  CLARITIN Take 10 mg by mouth daily as needed for allergies.   pseudoephedrine 30 MG tablet Commonly known as:  SUDAFED Take 30 mg by mouth every 4 (four) hours as needed for congestion.   Psyllium Husk Powd Take 5 mLs by mouth daily.       Diagnostic Studies: No results found.  They benefited maximally from their hospital stay and there were no complications.     Disposition: Discharge disposition: 01-Home or Self Care      Discharge Instructions    Call MD / Call 911   Complete by:  As directed    If you experience chest pain or shortness of breath, CALL 911 and be transported to the hospital emergency room.  If you develope a fever above  101 F, pus (white drainage) or increased drainage or redness at the wound, or calf pain, call your surgeon's office.   Constipation Prevention   Complete by:  As directed    Drink plenty of fluids.  Prune juice may be helpful.  You may use a stool softener, such as Colace (over the counter) 100 mg twice a day.  Use MiraLax (over the counter) for constipation as needed.   Diet - low sodium heart healthy   Complete by:  As directed    Increase activity slowly as tolerated   Complete by:  As directed      Follow-up Information    Roseanne Kaufman, MD Follow up in 14 day(s).   Specialty:  Orthopedic Surgery Why:  We will call you for a follow-up appointment in 14 days Contact information: [address] [place] [address] [phone]          Patient has been seen and examined. Patient has pain appropriate to his injury/process. Patient denies new complaints at this present time. I have discussed the care pathway with nursing staff. Patient is appropriate and alert.  We reviewed vital signs and intake output which are stable.  The upper extremity is neurovascularly intact. Refill is normal. There is no signs of compartment syndrome. There is no signs of dystrophy. There is normal sensation.  I have spent a  great deal of time discussing range of motion edema control and other techniques to decrease edema and promote flexion extension of the fingers. Patient understands the importance of elevation range of motion massage and other measures to lessen pain and prevent swelling.  We have also discussed immobilization to appropriate areas involved.  We have discussed with the patient shoulder range of motion to prevent adhesive capsulitis.  The remainder of the examination is normal today without complicating feature.  Drain was removed without difficulty  Patient will be discharged home. Will plan to see the patient back in the office as per discharge instructions  (please see discharge instructions).  Patient had an uneventful hospital course. At the time of discharge patient is stable awake alert and oriented in no acute distress. Regular diet will be continued and has been tolerated. Patient will notify should have problems occur. There is no signs of DVT infection or other complication at this juncture.  All questions have  been incurred and answered.  Please see discharge med list Final diagnosis status post open reduction internal fixation greater than 5 part distal radius fracture right upper extremity Shantika Bermea MD  Signed: Satira Anis Nickolette Espinola III 06/28/2018, 4:44 AM

## 2018-06-29 ENCOUNTER — Encounter (HOSPITAL_COMMUNITY): Payer: Self-pay | Admitting: Orthopedic Surgery

## 2018-06-29 NOTE — Anesthesia Postprocedure Evaluation (Signed)
Anesthesia Post Note  Patient: Gloria Wise  Procedure(s) Performed: OPEN REDUCTION INTERNAL FIXATION (ORIF) WRIST FRACTURE (Right )     Patient location during evaluation: PACU Anesthesia Type: General and Regional Level of consciousness: awake and alert Pain management: pain level controlled Vital Signs Assessment: post-procedure vital signs reviewed and stable Respiratory status: spontaneous breathing, nonlabored ventilation, respiratory function stable and patient connected to nasal cannula oxygen Cardiovascular status: blood pressure returned to baseline and stable Postop Assessment: no apparent nausea or vomiting Anesthetic complications: no    Last Vitals:  Vitals:   06/28/18 0915 06/28/18 1200  BP: (!) 132/49 (!) 101/53  Pulse: 88 74  Resp:    Temp: 36.9 C 36.8 C  SpO2: 98% 99%    Last Pain:  Vitals:   06/28/18 1427  TempSrc:   PainSc: 3    Pain Goal: Patients Stated Pain Goal: 3 (06/28/18 1326)                 [place]

## 2019-05-28 DIAGNOSIS — Z0001 Encounter for general adult medical examination with abnormal findings: Secondary | ICD-10-CM | POA: Diagnosis not present

## 2019-05-28 DIAGNOSIS — Z1159 Encounter for screening for other viral diseases: Secondary | ICD-10-CM | POA: Diagnosis not present

## 2019-05-31 DIAGNOSIS — Z0000 Encounter for general adult medical examination without abnormal findings: Secondary | ICD-10-CM | POA: Diagnosis not present

## 2019-05-31 DIAGNOSIS — E559 Vitamin D deficiency, unspecified: Secondary | ICD-10-CM | POA: Diagnosis not present

## 2019-05-31 DIAGNOSIS — J309 Allergic rhinitis, unspecified: Secondary | ICD-10-CM | POA: Diagnosis not present

## 2019-05-31 DIAGNOSIS — R3129 Other microscopic hematuria: Secondary | ICD-10-CM | POA: Diagnosis not present

## 2019-05-31 DIAGNOSIS — Z0001 Encounter for general adult medical examination with abnormal findings: Secondary | ICD-10-CM | POA: Diagnosis not present

## 2019-05-31 DIAGNOSIS — M8580 Other specified disorders of bone density and structure, unspecified site: Secondary | ICD-10-CM | POA: Diagnosis not present

## 2019-06-05 DIAGNOSIS — S6992XA Unspecified injury of left wrist, hand and finger(s), initial encounter: Secondary | ICD-10-CM | POA: Diagnosis not present

## 2019-06-05 DIAGNOSIS — W1839XA Other fall on same level, initial encounter: Secondary | ICD-10-CM | POA: Diagnosis not present

## 2019-06-05 DIAGNOSIS — Z882 Allergy status to sulfonamides status: Secondary | ICD-10-CM | POA: Diagnosis not present

## 2019-06-05 DIAGNOSIS — S82041A Displaced comminuted fracture of right patella, initial encounter for closed fracture: Secondary | ICD-10-CM | POA: Diagnosis not present

## 2019-06-05 DIAGNOSIS — S01412A Laceration without foreign body of left cheek and temporomandibular area, initial encounter: Secondary | ICD-10-CM | POA: Diagnosis not present

## 2019-06-05 DIAGNOSIS — Z880 Allergy status to penicillin: Secondary | ICD-10-CM | POA: Diagnosis not present

## 2019-06-06 DIAGNOSIS — S82001A Unspecified fracture of right patella, initial encounter for closed fracture: Secondary | ICD-10-CM | POA: Diagnosis not present

## 2019-06-07 ENCOUNTER — Encounter (HOSPITAL_COMMUNITY): Payer: Self-pay | Admitting: Orthopedic Surgery

## 2019-06-07 ENCOUNTER — Other Ambulatory Visit (HOSPITAL_COMMUNITY)
Admission: RE | Admit: 2019-06-07 | Discharge: 2019-06-07 | Disposition: A | Discharge status: Home / Self Care | Payer: Medicare PPO | Source: Ambulatory Visit | Attending: Orthopedic Surgery | Admitting: Orthopedic Surgery

## 2019-06-07 ENCOUNTER — Other Ambulatory Visit: Payer: Self-pay

## 2019-06-07 DIAGNOSIS — Z20822 Contact with and (suspected) exposure to covid-19: Secondary | ICD-10-CM | POA: Diagnosis not present

## 2019-06-07 DIAGNOSIS — Z01812 Encounter for preprocedural laboratory examination: Secondary | ICD-10-CM | POA: Diagnosis not present

## 2019-06-07 LAB — SARS CORONAVIRUS 2 (TAT 6-24 HRS): SARS Coronavirus 2: NEGATIVE

## 2019-06-07 NOTE — Progress Notes (Signed)
Spoke with pt for pre-op call. Pt denies cardiac history, HTN or Diabetes. Pt's PCP is Dr. Shelia Media.  Pt had her Covid test done today and is in quarantine. Pt understands to stay in it until she arrives to the hospital. Instructed her to have her son who will be driving her here to wear his mask and she wear her mask on the way to the hospital.  Pt will have another son come by and pick up her Pre-surgery ensure drink this evening.

## 2019-06-08 ENCOUNTER — Encounter (HOSPITAL_COMMUNITY): Payer: Self-pay | Admitting: Orthopedic Surgery

## 2019-06-08 ENCOUNTER — Ambulatory Visit (HOSPITAL_COMMUNITY): Payer: Medicare PPO | Admitting: Anesthesiology

## 2019-06-08 ENCOUNTER — Ambulatory Visit (HOSPITAL_COMMUNITY)
Admission: RE | Admit: 2019-06-08 | Discharge: 2019-06-08 | Disposition: A | Discharge status: Home / Self Care | Payer: Medicare PPO | Attending: Orthopedic Surgery | Admitting: Orthopedic Surgery

## 2019-06-08 ENCOUNTER — Encounter (HOSPITAL_COMMUNITY)
Admission: RE | Disposition: A | Discharge status: Home / Self Care | Payer: Self-pay | Source: Home / Self Care | Attending: Orthopedic Surgery

## 2019-06-08 ENCOUNTER — Ambulatory Visit (HOSPITAL_COMMUNITY): Payer: Medicare PPO

## 2019-06-08 DIAGNOSIS — Z881 Allergy status to other antibiotic agents status: Secondary | ICD-10-CM | POA: Diagnosis not present

## 2019-06-08 DIAGNOSIS — M1990 Unspecified osteoarthritis, unspecified site: Secondary | ICD-10-CM | POA: Insufficient documentation

## 2019-06-08 DIAGNOSIS — Z882 Allergy status to sulfonamides status: Secondary | ICD-10-CM | POA: Insufficient documentation

## 2019-06-08 DIAGNOSIS — S82041A Displaced comminuted fracture of right patella, initial encounter for closed fracture: Secondary | ICD-10-CM | POA: Diagnosis not present

## 2019-06-08 DIAGNOSIS — G8918 Other acute postprocedural pain: Secondary | ICD-10-CM | POA: Diagnosis not present

## 2019-06-08 DIAGNOSIS — Z880 Allergy status to penicillin: Secondary | ICD-10-CM | POA: Diagnosis not present

## 2019-06-08 DIAGNOSIS — Z85828 Personal history of other malignant neoplasm of skin: Secondary | ICD-10-CM | POA: Insufficient documentation

## 2019-06-08 DIAGNOSIS — Z79899 Other long term (current) drug therapy: Secondary | ICD-10-CM | POA: Diagnosis not present

## 2019-06-08 DIAGNOSIS — F419 Anxiety disorder, unspecified: Secondary | ICD-10-CM | POA: Diagnosis not present

## 2019-06-08 DIAGNOSIS — Z91040 Latex allergy status: Secondary | ICD-10-CM | POA: Insufficient documentation

## 2019-06-08 DIAGNOSIS — Z87891 Personal history of nicotine dependence: Secondary | ICD-10-CM | POA: Diagnosis not present

## 2019-06-08 DIAGNOSIS — S82031A Displaced transverse fracture of right patella, initial encounter for closed fracture: Secondary | ICD-10-CM | POA: Diagnosis not present

## 2019-06-08 DIAGNOSIS — W19XXXA Unspecified fall, initial encounter: Secondary | ICD-10-CM | POA: Diagnosis not present

## 2019-06-08 DIAGNOSIS — K219 Gastro-esophageal reflux disease without esophagitis: Secondary | ICD-10-CM | POA: Insufficient documentation

## 2019-06-08 DIAGNOSIS — T148XXA Other injury of unspecified body region, initial encounter: Secondary | ICD-10-CM

## 2019-06-08 DIAGNOSIS — S82001A Unspecified fracture of right patella, initial encounter for closed fracture: Secondary | ICD-10-CM | POA: Diagnosis not present

## 2019-06-08 HISTORY — DX: Family history of other specified conditions: Z84.89

## 2019-06-08 HISTORY — PX: ORIF PATELLA: SHX5033

## 2019-06-08 LAB — CBC
HCT: 42.5 % (ref 36.0–46.0)
Hemoglobin: 14 g/dL (ref 12.0–15.0)
MCH: 31.5 pg (ref 26.0–34.0)
MCHC: 32.9 g/dL (ref 30.0–36.0)
MCV: 95.7 fL (ref 80.0–100.0)
Platelets: 340 10*3/uL (ref 150–400)
RBC: 4.44 MIL/uL (ref 3.87–5.11)
RDW: 12.1 % (ref 11.5–15.5)
WBC: 10.5 10*3/uL (ref 4.0–10.5)
nRBC: 0 % (ref 0.0–0.2)

## 2019-06-08 SURGERY — OPEN REDUCTION INTERNAL FIXATION (ORIF) PATELLA
Anesthesia: General | Site: Knee | Laterality: Right

## 2019-06-08 MED ORDER — LACTATED RINGERS IV SOLN: INTRAVENOUS | Status: DC

## 2019-06-08 MED ORDER — MEPERIDINE HCL 25 MG/ML IJ SOLN
6.2500 mg | INTRAMUSCULAR | Status: DC | PRN
  Administered 2019-06-08: 12.5 mg via INTRAVENOUS

## 2019-06-08 MED ORDER — MIDAZOLAM HCL 2 MG/2ML IJ SOLN
2.0000 mg | Freq: Once | INTRAMUSCULAR | Status: AC
  Filled 2019-06-08: qty 2

## 2019-06-08 MED ORDER — LIDOCAINE 2% (20 MG/ML) 5 ML SYRINGE
INTRAMUSCULAR | Status: DC | PRN
  Administered 2019-06-08: 40 mg via INTRAVENOUS

## 2019-06-08 MED ORDER — CHLORHEXIDINE GLUCONATE 4 % EX LIQD: 60.0000 mL | Freq: Once | CUTANEOUS | Status: DC

## 2019-06-08 MED ORDER — MIDAZOLAM HCL 2 MG/2ML IJ SOLN
INTRAMUSCULAR | Status: AC
  Administered 2019-06-08: 2 mg via INTRAVENOUS
  Filled 2019-06-08: qty 2

## 2019-06-08 MED ORDER — PHENYLEPHRINE 40 MCG/ML (10ML) SYRINGE FOR IV PUSH (FOR BLOOD PRESSURE SUPPORT)
PREFILLED_SYRINGE | INTRAVENOUS | Status: AC
  Filled 2019-06-08: qty 20

## 2019-06-08 MED ORDER — ONDANSETRON 4 MG PO TBDP: 4.0000 mg | ORAL_TABLET | Freq: Three times a day (TID) | ORAL | 0 refills | Status: DC | PRN

## 2019-06-08 MED ORDER — ACETAMINOPHEN 325 MG PO TABS: 325.0000 mg | ORAL_TABLET | ORAL | Status: DC | PRN

## 2019-06-08 MED ORDER — FENTANYL CITRATE (PF) 100 MCG/2ML IJ SOLN
INTRAMUSCULAR | Status: AC
  Administered 2019-06-08: 14:00:00 100 ug via INTRAVENOUS
  Filled 2019-06-08: qty 2

## 2019-06-08 MED ORDER — DEXAMETHASONE SODIUM PHOSPHATE 10 MG/ML IJ SOLN
INTRAMUSCULAR | Status: AC
  Filled 2019-06-08: qty 1

## 2019-06-08 MED ORDER — BUPIVACAINE LIPOSOME 1.3 % IJ SUSP
INTRAMUSCULAR | Status: DC | PRN
  Administered 2019-06-08: 10 mL via PERINEURAL

## 2019-06-08 MED ORDER — SENNOSIDES-DOCUSATE SODIUM 8.6-50 MG PO TABS: 1.0000 | ORAL_TABLET | Freq: Every day | ORAL | 1 refills | Status: AC | PRN

## 2019-06-08 MED ORDER — 0.9 % SODIUM CHLORIDE (POUR BTL) OPTIME
TOPICAL | Status: DC | PRN
  Administered 2019-06-08: 16:00:00 1000 mL

## 2019-06-08 MED ORDER — DEXAMETHASONE SODIUM PHOSPHATE 10 MG/ML IJ SOLN
INTRAMUSCULAR | Status: DC | PRN
  Administered 2019-06-08: 4 mg via INTRAVENOUS

## 2019-06-08 MED ORDER — FENTANYL CITRATE (PF) 100 MCG/2ML IJ SOLN: 25.0000 ug | INTRAMUSCULAR | Status: DC | PRN

## 2019-06-08 MED ORDER — LIDOCAINE 2% (20 MG/ML) 5 ML SYRINGE
INTRAMUSCULAR | Status: AC
  Filled 2019-06-08: qty 10

## 2019-06-08 MED ORDER — FENTANYL CITRATE (PF) 250 MCG/5ML IJ SOLN
INTRAMUSCULAR | Status: AC
  Filled 2019-06-08: qty 5

## 2019-06-08 MED ORDER — PROPOFOL 10 MG/ML IV BOLUS
INTRAVENOUS | Status: DC | PRN
  Administered 2019-06-08: 140 mg via INTRAVENOUS

## 2019-06-08 MED ORDER — BUPIVACAINE-EPINEPHRINE (PF) 0.25% -1:200000 IJ SOLN
INTRAMUSCULAR | Status: DC | PRN
  Administered 2019-06-08: 30 mL

## 2019-06-08 MED ORDER — CEFAZOLIN SODIUM-DEXTROSE 2-4 GM/100ML-% IV SOLN
INTRAVENOUS | Status: AC
  Filled 2019-06-08: qty 100

## 2019-06-08 MED ORDER — FENTANYL CITRATE (PF) 250 MCG/5ML IJ SOLN
INTRAMUSCULAR | Status: DC | PRN
  Administered 2019-06-08: 50 ug via INTRAVENOUS

## 2019-06-08 MED ORDER — FENTANYL CITRATE (PF) 100 MCG/2ML IJ SOLN
100.0000 ug | Freq: Once | INTRAMUSCULAR | Status: AC
  Filled 2019-06-08: qty 2

## 2019-06-08 MED ORDER — ONDANSETRON HCL 4 MG/2ML IJ SOLN
INTRAMUSCULAR | Status: DC | PRN
  Administered 2019-06-08: 4 mg via INTRAVENOUS

## 2019-06-08 MED ORDER — SUCCINYLCHOLINE CHLORIDE 200 MG/10ML IV SOSY
PREFILLED_SYRINGE | INTRAVENOUS | Status: AC
  Filled 2019-06-08: qty 10

## 2019-06-08 MED ORDER — CEFAZOLIN SODIUM-DEXTROSE 2-4 GM/100ML-% IV SOLN
2.0000 g | INTRAVENOUS | Status: AC
  Administered 2019-06-08: 16:00:00 2 g via INTRAVENOUS

## 2019-06-08 MED ORDER — OXYCODONE HCL 5 MG/5ML PO SOLN: 5.0000 mg | Freq: Once | ORAL | Status: DC | PRN

## 2019-06-08 MED ORDER — MEPERIDINE HCL 25 MG/ML IJ SOLN
INTRAMUSCULAR | Status: AC
  Filled 2019-06-08: qty 1

## 2019-06-08 MED ORDER — MIDAZOLAM HCL 5 MG/5ML IJ SOLN
INTRAMUSCULAR | Status: DC | PRN
  Administered 2019-06-08 (×2): 1 mg via INTRAVENOUS

## 2019-06-08 MED ORDER — OXYCODONE HCL 5 MG PO TABS: 5.0000 mg | ORAL_TABLET | Freq: Once | ORAL | Status: DC | PRN

## 2019-06-08 MED ORDER — PHENYLEPHRINE 40 MCG/ML (10ML) SYRINGE FOR IV PUSH (FOR BLOOD PRESSURE SUPPORT)
PREFILLED_SYRINGE | INTRAVENOUS | Status: DC | PRN
  Administered 2019-06-08: 80 ug via INTRAVENOUS
  Administered 2019-06-08: 40 ug via INTRAVENOUS

## 2019-06-08 MED ORDER — EPHEDRINE 5 MG/ML INJ
INTRAVENOUS | Status: AC
  Filled 2019-06-08: qty 10

## 2019-06-08 MED ORDER — ONDANSETRON HCL 4 MG/2ML IJ SOLN
INTRAMUSCULAR | Status: AC
  Filled 2019-06-08: qty 6

## 2019-06-08 MED ORDER — ONDANSETRON HCL 4 MG/2ML IJ SOLN: 4.0000 mg | Freq: Once | INTRAMUSCULAR | Status: DC | PRN

## 2019-06-08 MED ORDER — EPHEDRINE SULFATE-NACL 50-0.9 MG/10ML-% IV SOSY
PREFILLED_SYRINGE | INTRAVENOUS | Status: DC | PRN
  Administered 2019-06-08: 5 mg via INTRAVENOUS

## 2019-06-08 MED ORDER — ACETAMINOPHEN 160 MG/5ML PO SOLN: 325.0000 mg | ORAL | Status: DC | PRN

## 2019-06-08 MED ORDER — OXYCODONE HCL 5 MG PO TABS: 5.0000 mg | ORAL_TABLET | ORAL | 0 refills | Status: AC | PRN

## 2019-06-08 MED ORDER — MIDAZOLAM HCL 2 MG/2ML IJ SOLN
INTRAMUSCULAR | Status: AC
  Filled 2019-06-08: qty 2

## 2019-06-08 SURGICAL SUPPLY — 72 items
ALCOHOL 70% 16 OZ (MISCELLANEOUS) ×3 IMPLANT
BIT DRILL 2.6 CANN (BIT) ×2 IMPLANT
BNDG COHESIVE 4X5 TAN STRL (GAUZE/BANDAGES/DRESSINGS) IMPLANT
BNDG COHESIVE 6X5 TAN STRL LF (GAUZE/BANDAGES/DRESSINGS) ×3 IMPLANT
BNDG ELASTIC 4X5.8 VLCR STR LF (GAUZE/BANDAGES/DRESSINGS) IMPLANT
BNDG ELASTIC 6X5.8 VLCR STR LF (GAUZE/BANDAGES/DRESSINGS) IMPLANT
COVER SURGICAL LIGHT HANDLE (MISCELLANEOUS) ×3 IMPLANT
COVER WAND RF STERILE (DRAPES) ×3 IMPLANT
CUFF TOURN SGL QUICK 34 (TOURNIQUET CUFF)
CUFF TOURN SGL QUICK 42 (TOURNIQUET CUFF) IMPLANT
CUFF TRNQT CYL 34X4.125X (TOURNIQUET CUFF) IMPLANT
DRAPE C-ARM 42X72 X-RAY (DRAPES) IMPLANT
DRAPE C-ARMOR (DRAPES) IMPLANT
DRAPE IMP U-DRAPE 54X76 (DRAPES) ×3 IMPLANT
DRAPE INCISE IOBAN 66X45 STRL (DRAPES) ×3 IMPLANT
DRAPE U-SHAPE 47X51 STRL (DRAPES) IMPLANT
DRSG PAD ABDOMINAL 8X10 ST (GAUZE/BANDAGES/DRESSINGS) ×2 IMPLANT
DURAPREP 26ML APPLICATOR (WOUND CARE) ×3 IMPLANT
ELECT CAUTERY BLADE 6.4 (BLADE) ×3 IMPLANT
ELECT REM PT RETURN 9FT ADLT (ELECTROSURGICAL) ×3
ELECTRODE REM PT RTRN 9FT ADLT (ELECTROSURGICAL) ×1 IMPLANT
FIBERTAPE 2 W/STRL NDL 17 (SUTURE) ×2 IMPLANT
GAUZE SPONGE 4X4 12PLY STRL (GAUZE/BANDAGES/DRESSINGS) ×3 IMPLANT
GAUZE SPONGE 4X4 16PLY XRAY LF (GAUZE/BANDAGES/DRESSINGS) ×2 IMPLANT
GAUZE XEROFORM 5X9 LF (GAUZE/BANDAGES/DRESSINGS) ×2 IMPLANT
GLOVE BIO SURGEON STRL SZ7.5 (GLOVE) ×3 IMPLANT
GLOVE BIOGEL PI IND STRL 8 (GLOVE) ×2 IMPLANT
GLOVE BIOGEL PI INDICATOR 8 (GLOVE) ×4
GOWN STRL REUS W/ TWL LRG LVL3 (GOWN DISPOSABLE) ×1 IMPLANT
GOWN STRL REUS W/ TWL XL LVL3 (GOWN DISPOSABLE) ×1 IMPLANT
GOWN STRL REUS W/TWL LRG LVL3 (GOWN DISPOSABLE) ×3
GOWN STRL REUS W/TWL XL LVL3 (GOWN DISPOSABLE) ×3
GUIDEWIRE 1.35MM  DUAL TROCAR (WIRE) ×4
GUIDEWIRE 1.35MM DUAL TROCAR (WIRE) IMPLANT
IMMOBILIZER KNEE 20 (SOFTGOODS) ×3
IMMOBILIZER KNEE 20 THIGH 36 (SOFTGOODS) IMPLANT
KIT BASIN OR (CUSTOM PROCEDURE TRAY) ×3 IMPLANT
KIT TURNOVER KIT B (KITS) ×3 IMPLANT
NDL HYPO 25GX1X1/2 BEV (NEEDLE) IMPLANT
NEEDLE HYPO 25GX1X1/2 BEV (NEEDLE) IMPLANT
NS IRRIG 1000ML POUR BTL (IV SOLUTION) ×3 IMPLANT
PACK ORTHO EXTREMITY (CUSTOM PROCEDURE TRAY) ×3 IMPLANT
PAD ARMBOARD 7.5X6 YLW CONV (MISCELLANEOUS) ×6 IMPLANT
PAD CAST 3X4 CTTN HI CHSV (CAST SUPPLIES) IMPLANT
PAD CAST 4YDX4 CTTN HI CHSV (CAST SUPPLIES) IMPLANT
PADDING CAST COTTON 3X4 STRL (CAST SUPPLIES)
PADDING CAST COTTON 4X4 STRL (CAST SUPPLIES) ×3
PADDING CAST COTTON 6X4 STRL (CAST SUPPLIES) ×2 IMPLANT
PASSER SUT SWANSON 36MM LOOP (INSTRUMENTS) IMPLANT
SCREW 4X36MM CANN LO-PRO (Screw) ×2 IMPLANT
SCREW BLUNT TIP 4MMX32 (Screw) ×2 IMPLANT
SCREW CANN BLUNT TIP LP 4X46 (Screw) ×2 IMPLANT
SPONGE LAP 18X18 RF (DISPOSABLE) ×3 IMPLANT
STOCKINETTE IMPERVIOUS LG (DRAPES) ×3 IMPLANT
SUCTION FRAZIER HANDLE 10FR (MISCELLANEOUS) ×2
SUCTION TUBE FRAZIER 10FR DISP (MISCELLANEOUS) ×1 IMPLANT
SUT ETHIBOND 5 LR DA (SUTURE) ×9 IMPLANT
SUT ETHILON 2 0 FS 18 (SUTURE) IMPLANT
SUT ETHILON 3 0 PS 1 (SUTURE) IMPLANT
SUT FIBERWIRE #2 38 T-5 BLUE (SUTURE)
SUT VIC AB 0 CT1 27 (SUTURE)
SUT VIC AB 0 CT1 27XBRD ANBCTR (SUTURE) IMPLANT
SUT VIC AB 2-0 CT1 27 (SUTURE)
SUT VIC AB 2-0 CT1 TAPERPNT 27 (SUTURE) IMPLANT
SUTURE FIBERWR #2 38 T-5 BLUE (SUTURE) IMPLANT
SYR CONTROL 10ML LL (SYRINGE) IMPLANT
TOWEL GREEN STERILE (TOWEL DISPOSABLE) ×6 IMPLANT
TOWEL GREEN STERILE FF (TOWEL DISPOSABLE) ×3 IMPLANT
TUBE CONNECTING 12'X1/4 (SUCTIONS) ×1
TUBE CONNECTING 12X1/4 (SUCTIONS) ×2 IMPLANT
UNDERPAD 30X30 (UNDERPADS AND DIAPERS) ×3 IMPLANT
WATER STERILE IRR 1000ML POUR (IV SOLUTION) ×3 IMPLANT

## 2019-06-08 NOTE — Transfer of Care (Signed)
Immediate Anesthesia Transfer of Care Note  Patient: Gloria Wise  Procedure(s) Performed: OPEN REDUCTION INTERNAL (ORIF)  PATELLA (Right Knee)  Patient Location: PACU  Anesthesia Type:General  Level of Consciousness: awake, alert  and oriented  Airway & Oxygen Therapy: Patient Spontanous Breathing and Patient connected to nasal cannula oxygen  Post-op Assessment: Report given to RN and Post -op Vital signs reviewed and stable  Post vital signs: Reviewed and stable  Last Vitals:  Vitals Value Taken Time  BP    Temp    Pulse 90 06/08/19 1638  Resp 12 06/08/19 1638  SpO2 100 % 06/08/19 1638  Vitals shown include unvalidated device data.  Last Pain:  Vitals:   06/08/19 1341  TempSrc:   PainSc: 0-No pain      Patients Stated Pain Goal: 3 ([id number])  Complications: No apparent anesthesia complications

## 2019-06-08 NOTE — Progress Notes (Signed)
Orthopedic Tech Progress Note Patient Details:  Gloria Wise 1951/04/15 [id number]  Patient ID: Ma Rings, female   DOB: [birth year], 69 y.o.   MRN: [id number]   [place] 06/08/2019, 4:33 PM [place] brace order was canceled by dr Stann Mainland.

## 2019-06-08 NOTE — Discharge Instructions (Signed)
-  Maintain postoperative bandage for 3 days.  You may remove the bandage on the fourth day.  You may shower at that point time but do not submerge underwater.  -50% weightbearing to the right lower extremity.  -For mild to moderate pain use Tylenol and Advil around-the-clock.  For breakthrough pain use oxycodone as needed.  -Apply ice to the right knee for 20 to 30 minutes/h that you are awake.  -Return to see Dr. Stann Mainland in 2 weeks for routine postop wound check.

## 2019-06-08 NOTE — Progress Notes (Signed)
Orthopedic Tech Progress Note Patient Details:  Gloria Wise Dec 17, 1950 [id number]  Patient ID: Gloria Wise, female   DOB: 1950/09/23, 69 y.o.   MRN: [id number]   [name] [employer] for brace. 06/08/2019, 4:09 PM

## 2019-06-08 NOTE — Op Note (Signed)
06/08/2019  4:55 PM  PATIENT:  Gloria Wise    PRE-OPERATIVE DIAGNOSIS:  Right knee patella fracture  POST-OPERATIVE DIAGNOSIS:  Same  PROCEDURE:  OPEN REDUCTION INTERNAL (ORIF)  PATELLA  SURGEON:  Nicholes Stairs, MD  ASSISTANT: Laure Kidney, RNFA  ANESTHESIA:   General  PREOPERATIVE INDICATIONS:  Gloria Wise is a  69 y.o. female with a diagnosis of Right knee patella fracture who elected for surgical management in order to restore the function of the extensor mechanism.    The risks benefits and alternatives were discussed with the patient preoperatively including but not limited to the risks of infection, bleeding, nerve injury, cardiopulmonary complications, the need for revision surgery, hardware prominence, hardware failure, the need for hardware removal, nonunion, malunion, posttraumatic arthritis, stiffness, loss of strength and function, among others, and the patient was willing to proceed.  OPERATIVE IMPLANTS: Arthrex 4.0 mm cannulated screws x2 with a total of 2 #2 FiberWire going through the cannulated screws in a figure-of-eight cerclage fashion  OPERATIVE FINDINGS: Displaced patella fracture  OPERATIVE PROCEDURE: The patient was brought to the operating room and placed in the supine position. General anesthesia was administered. IV antibiotics were given. The lower extremity was prepped and draped in usual sterile fashion. The leg was elevated and exsanguinated and the tourniquet was inflated. Time out was performed.   Anterior incision was made over the patella and the fracture fragments identified and cleaned of hematoma. The retinaculum was torn on either side.  I reduced the fracture anatomically and held provisionally with a clamp and placed 2 guidewires for the cannulated screws.  The lengths were measured, after being confirmed on C-arm, and then I placed the screws, taking care to make sure that there were threads only on the proximal segment, providing  compression at the fracture site, and the tips were not prominent proximally.  C-arm used to confirm reduction and position of the screws, and once I was satisfied with this I then used a Keith needle through the screws bringing a FiberTape suture into a figure of 8 fashion for compression. This provided excellent secondary fixation. I left the screw lengths slightly short of the far cortex in order to minimize the risk for rupture of the FiberWire over the tip of the screws.  The wounds were irrigated copiously and the retinaculum repaired with Vicryl followed by Vicryl for the subcutaneous tissue with Steri-Strips and sterile gauze for the skin. The wounds were also injected. A knee immobilizer was applied. The patient was awakened and returned to the PACU in stable and satisfactory condition. There were no complications.   Disposition:  Gloria Wise will be 50% weightbearing to the right lower extremity.  She will maintain her knee immobilizer at all times.  Weightbearing with crutches only.  She will be on 81 mg aspirin once per day for 6 weeks for DVT prophylaxis.  Return to the clinic in 2 weeks for routine postop wound check.

## 2019-06-08 NOTE — Progress Notes (Signed)
12.5 mg of meperidine wasted after pt discharged. Waste witnessed by [employer]

## 2019-06-08 NOTE — Brief Op Note (Signed)
06/08/2019  4:48 PM  PATIENT:  Gloria Wise  69 y.o. female  PRE-OPERATIVE DIAGNOSIS:  Right knee patella fracture  POST-OPERATIVE DIAGNOSIS:  Right knee patella fracture  PROCEDURE:  Procedure(s) with comments: OPEN REDUCTION INTERNAL (ORIF)  PATELLA (Right) - 90 mins  SURGEON:  Surgeon(s) and Role:    * Stann Mainland, Elly Modena, MD - Primary  PHYSICIAN ASSISTANT:   ASSISTANTS: Laure Kidney, RNFA   ANESTHESIA:   regional and general  EBL:  5 mL   BLOOD ADMINISTERED:none  DRAINS: none   LOCAL MEDICATIONS USED:  NONE  SPECIMEN:  No Specimen  DISPOSITION OF SPECIMEN:  N/A  COUNTS:  YES  TOURNIQUET:   Total Tourniquet Time Documented: Thigh (Right) - 29 minutes Total: Thigh (Right) - 29 minutes   DICTATION: .Note written in EPIC  PLAN OF CARE: Discharge to home after PACU  PATIENT DISPOSITION:  PACU - hemodynamically stable.   Delay start of Pharmacological VTE agent (>24hrs) due to surgical blood loss or risk of bleeding: not applicable

## 2019-06-08 NOTE — H&P (Signed)
ORTHOPAEDIC H and P  REQUESTING PHYSICIAN: Nicholes Stairs, MD  PCP:  Deland Pretty, MD  Chief Complaint: Right knee pain  HPI: Gloria Wise is a 69 y.o. female who complains of right knee pain with inability to extend the knee following a fall on Tuesday evening.  She was seen at the emergency department in [place].  She was noted to have a transverse patella fracture with some displacement.  She is here today for internal fixation.  No new complaints.  Past Medical History:  Diagnosis Date  . Anemia    after first child  . Arthritis   . Cancer ([place])    Basal cell on face  . Family history of adverse reaction to anesthesia    mother had N/V  . GERD (gastroesophageal reflux disease)   . Pneumonia    Past Surgical History:  Procedure Laterality Date  . APPENDECTOMY    . CHOLECYSTECTOMY    . COLONOSCOPY    . hemorroid banding    . MOUTH SURGERY    . ORIF WRIST FRACTURE Right 06/27/2018   Procedure: OPEN REDUCTION INTERNAL FIXATION (ORIF) WRIST FRACTURE;  Surgeon: Roseanne Kaufman, MD;  Location: [facility];  Service: Orthopedics;  Laterality: Right;  90 mins  . TONSILLECTOMY    . TUBAL LIGATION     Social History   Socioeconomic History  . Marital status: Widowed    Spouse name: Not on file  . Number of children: Not on file  . Years of education: Not on file  . Highest education level: Not on file  Occupational History  . Not on file  Tobacco Use  . Smoking status: Former Research scientist (life sciences)  . Smokeless tobacco: Never Used  . 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 Alcohol use: Yes    Comment: occasional wine  . Drug use: Never  . Sexual activity: Not on file  Other Topics Concern  . Not on file  Social History Narrative  . Not on file   Social Determinants of Health   Financial Resource Strain:   . Difficulty of Paying Living Expenses: Not on file  Food Insecurity:   . Worried About Charity fundraiser in the Last Year: Not  on file  . Ran Out of Food in the Last Year: Not on file  Transportation Needs:   . Lack of Transportation (Medical): Not on file  . Lack of Transportation (Non-Medical): Not on file  Physical Activity:   . Days of Exercise per Week: Not on file  . Minutes of Exercise per Session: Not on file  Stress:   . Feeling of Stress : Not on file  Social Connections:   . Frequency of Communication with Friends and Family: Not on file  . Frequency of Social Gatherings with Friends and Family: Not on file  . Attends Religious Services: Not on file  . Active Member of Clubs or Organizations: Not on file  . Attends Archivist Meetings: Not on file  . Marital Status: Not on file   History reviewed. No pertinent family history. Allergies  Allergen Reactions  . Latex Other (See Comments)    Skin irritation   . Penicillins Diarrhea    Upset stomach  Did it involve swelling of the face/tongue/throat, SOB, or low BP? No Did it involve sudden or severe rash/hives, skin peeling, or any reaction on the inside of your mouth or nose? No Did you need to seek medical  attention at a hospital or doctor's office? No When did it last happen?10-12 years If all above answers are "NO", may proceed with cephalosporin use.   . Sulfa Antibiotics Rash    All over   Prior to Admission medications   Medication Sig Start Date End Date Taking? Authorizing Provider  acetaminophen (TYLENOL) 500 MG tablet Take 500-1,000 mg by mouth every 6 (six) hours as needed for moderate pain or headache.   Yes [provider]  ARTIFICIAL TEAR SOLUTION OP Place 1 drop into both eyes daily as needed (dry eyes).   Yes [provider]  Ascorbic Acid (VITAMIN C PO) Take 2 tablets by mouth daily as needed (immune support).   Yes [provider]  Black Elderberry (SAMBUCUS ELDERBERRY PO) Take 2 tablets by mouth daily.   Yes [provider]  Calcium Citrate-Vitamin D (CALCIUM + D PO) Take 3  tablets by mouth daily.   Yes [provider]  cetirizine (ZYRTEC) 10 MG tablet Take 10 mg by mouth daily as needed for allergies.   Yes [provider]  cholecalciferol (VITAMIN D3) 25 MCG (1000 UT) tablet Take 1,000 Units by mouth daily.   Yes [provider]  famotidine (PEPCID AC MAXIMUM STRENGTH) 20 MG tablet Take 20 mg by mouth daily as needed for heartburn or indigestion.   Yes [provider]  Psyllium Husk POWD Take 1 Dose by mouth daily.    Yes [provider]  sodium chloride (OCEAN) 0.65 % SOLN nasal spray Place 1 spray into both nostrils as needed for congestion.   Yes [provider]  triamcinolone cream (KENALOG) 0.1 % Apply 1 application topically daily as needed (irritation).   Yes [provider]   No results found.  Positive ROS: All other systems have been reviewed and were otherwise negative with the exception of those mentioned in the HPI and as above.  Physical Exam: General: Alert, no acute distress Cardiovascular: No pedal edema Respiratory: No cyanosis, no use of accessory musculature GI: No organomegaly, abdomen is soft and non-tender Skin: No lesions in the area of chief complaint Neurologic: Sensation intact distally Psychiatric: Patient is competent for consent with normal mood and affect Lymphatic: No axillary or cervical lymphadenopathy  MUSCULOSKELETAL:  Right knee:  There is some mild swelling noted and bruising.  Otherwise no open wounds.  She is neurovascularly intact throughout.  Assessment: Closed right patella fracture  Plan: -Plan for open reduction with internal fixation of the right patella today.  We again discussed the risk and benefits of this procedure which we have previously reviewed in depth.  She has provided informed consent to proceed.  -We will place her in a hinged brace postoperatively and plan for discharge home from PACU postop.    Nicholes Stairs, MD Cell  [phone]    06/08/2019 3:03 PM

## 2019-06-08 NOTE — Anesthesia Procedure Notes (Signed)
Procedure Name: LMA Insertion Date/Time: 06/08/2019 3:28 PM Performed by: Colin Benton, CRNA Pre-anesthesia Checklist: Patient identified, Emergency Drugs available, Suction available and Patient being monitored Patient Re-evaluated:Patient Re-evaluated prior to induction Oxygen Delivery Method: Circle system utilized Preoxygenation: Pre-oxygenation with 100% oxygen Induction Type: IV induction Ventilation: Mask ventilation without difficulty LMA: LMA inserted LMA Size: 4.0 Number of attempts: 1 Placement Confirmation: positive ETCO2 and breath sounds checked- equal and bilateral Tube secured with: Tape Dental Injury: Teeth and Oropharynx as per pre-operative assessment

## 2019-06-08 NOTE — Anesthesia Preprocedure Evaluation (Signed)
Anesthesia Evaluation  Patient identified by MRN, date of birth, ID band Patient awake    Reviewed: Allergy & Precautions, NPO status , Patient's Chart, lab work & pertinent test results  Airway Mallampati: II  TM Distance: <3 FB Neck ROM: Full    Dental  (+) Teeth Intact, Dental Advisory Given   Pulmonary former smoker,    Pulmonary exam normal breath sounds clear to auscultation       Cardiovascular Exercise Tolerance: Good negative cardio ROS Normal cardiovascular exam Rhythm:Regular Rate:Normal     Neuro/Psych PSYCHIATRIC DISORDERS Anxiety negative neurological ROS     GI/Hepatic Neg liver ROS, GERD  Medicated,  Endo/Other  negative endocrine ROS  Renal/GU negative Renal ROS     Musculoskeletal  (+) Arthritis , Right wrist fracture   Abdominal   Peds  Hematology negative hematology ROS (+)   Anesthesia Other Findings Day of surgery medications reviewed with the patient.  Reproductive/Obstetrics                             Anesthesia Physical  Anesthesia Plan  ASA: II  Anesthesia Plan: General   Post-op Pain Management: GA combined w/ Regional for post-op pain   Induction: Intravenous  PONV Risk Score and Plan: 3 and Midazolam, Dexamethasone and Ondansetron  Airway Management Planned: LMA and Oral ETT  Additional Equipment:   Intra-op Plan:   Post-operative Plan: Extubation in OR  Informed Consent: I have reviewed the patients History and Physical, chart, labs and discussed the procedure including the risks, benefits and alternatives for the proposed anesthesia with the patient or authorized representative who has indicated his/her understanding and acceptance.     Dental advisory given  Plan Discussed with: CRNA, Anesthesiologist and Surgeon  Anesthesia Plan Comments: (Discussed both nerve block for pain relief post-op and GA; including NV, sore throat, dental  injury, and pulmonary complications)        Anesthesia Quick Evaluation

## 2019-06-08 NOTE — Anesthesia Procedure Notes (Signed)
Anesthesia Regional Block: Femoral nerve block   Pre-Anesthetic Checklist: ,, timeout performed, Correct Patient, Correct Site, Correct Laterality, Correct Procedure, Correct Position, site marked, Risks and benefits discussed,  Surgical consent,  Pre-op evaluation,  At surgeon's request and post-op pain management  Laterality: Right  Prep: chloraprep       Needles:  Injection technique: Single-shot  Needle Type: Echogenic Stimulator Needle     Needle Length: 5cm  Needle Gauge: 22     Additional Needles:   Procedures:, nerve stimulator,,, ultrasound used (permanent image in chart),,,,   Nerve Stimulator or Paresthesia:  Response: quadraceps contraction, 0.45 mA,   Additional Responses:   Narrative:  Start time: 06/08/2019 2:15 PM End time: 06/08/2019 2:20 PM Injection made incrementally with aspirations every 5 mL.  Performed by: Personally  Anesthesiologist: Janeece Riggers, MD  Additional Notes: Functioning IV was confirmed and monitors were applied.  A 73mm 22ga Arrow echogenic stimulator needle was used. Sterile prep and drape,hand hygiene and sterile gloves were used. Ultrasound guidance: relevant anatomy identified, needle position confirmed, local anesthetic spread visualized around nerve(s)., vascular puncture avoided.  Image printed for medical record. Negative aspiration and negative test dose prior to incremental administration of local anesthetic. The patient tolerated the procedure well.

## 2019-06-11 ENCOUNTER — Encounter: Payer: Self-pay | Admitting: *Deleted

## 2019-06-11 NOTE — Anesthesia Postprocedure Evaluation (Signed)
Anesthesia Post Note  Patient: Ma Rings  Procedure(s) Performed: OPEN REDUCTION INTERNAL (ORIF)  PATELLA (Right Knee)     Patient location during evaluation: PACU Anesthesia Type: General Level of consciousness: awake and alert Pain management: pain level controlled Vital Signs Assessment: post-procedure vital signs reviewed and stable Respiratory status: spontaneous breathing, nonlabored ventilation, respiratory function stable and patient connected to nasal cannula oxygen Cardiovascular status: blood pressure returned to baseline and stable Postop Assessment: no apparent nausea or vomiting Anesthetic complications: no    Last Vitals:  Vitals:   06/08/19 1655 06/08/19 1710  BP: 123/65 126/72  Pulse: 79 83  Resp: 11 19  Temp:  36.6 C  SpO2: 94% 97%    Last Pain:  Vitals:   06/08/19 1710  TempSrc:   PainSc: 0-No pain                 Chriss Mannan

## 2019-06-15 DIAGNOSIS — M25561 Pain in right knee: Secondary | ICD-10-CM | POA: Diagnosis not present

## 2019-06-20 DIAGNOSIS — M25561 Pain in right knee: Secondary | ICD-10-CM | POA: Diagnosis not present

## 2019-06-22 DIAGNOSIS — S82001D Unspecified fracture of right patella, subsequent encounter for closed fracture with routine healing: Secondary | ICD-10-CM | POA: Diagnosis not present

## 2019-06-22 DIAGNOSIS — Z4789 Encounter for other orthopedic aftercare: Secondary | ICD-10-CM | POA: Diagnosis not present

## 2019-06-22 DIAGNOSIS — M25561 Pain in right knee: Secondary | ICD-10-CM | POA: Diagnosis not present

## 2019-06-26 DIAGNOSIS — M25561 Pain in right knee: Secondary | ICD-10-CM | POA: Diagnosis not present

## 2019-06-28 DIAGNOSIS — M25561 Pain in right knee: Secondary | ICD-10-CM | POA: Diagnosis not present

## 2019-07-03 DIAGNOSIS — M25561 Pain in right knee: Secondary | ICD-10-CM | POA: Diagnosis not present

## 2019-07-05 DIAGNOSIS — M25561 Pain in right knee: Secondary | ICD-10-CM | POA: Diagnosis not present

## 2019-07-10 DIAGNOSIS — M25561 Pain in right knee: Secondary | ICD-10-CM | POA: Diagnosis not present

## 2019-07-17 DIAGNOSIS — M25561 Pain in right knee: Secondary | ICD-10-CM | POA: Diagnosis not present

## 2019-07-19 DIAGNOSIS — M25561 Pain in right knee: Secondary | ICD-10-CM | POA: Diagnosis not present

## 2019-07-23 DIAGNOSIS — Z4789 Encounter for other orthopedic aftercare: Secondary | ICD-10-CM | POA: Diagnosis not present

## 2019-07-25 DIAGNOSIS — M25561 Pain in right knee: Secondary | ICD-10-CM | POA: Diagnosis not present

## 2019-07-27 DIAGNOSIS — M25561 Pain in right knee: Secondary | ICD-10-CM | POA: Diagnosis not present

## 2019-08-01 DIAGNOSIS — M25561 Pain in right knee: Secondary | ICD-10-CM | POA: Diagnosis not present

## 2019-08-03 DIAGNOSIS — M25561 Pain in right knee: Secondary | ICD-10-CM | POA: Diagnosis not present

## 2019-08-08 DIAGNOSIS — M25561 Pain in right knee: Secondary | ICD-10-CM | POA: Diagnosis not present

## 2019-08-10 DIAGNOSIS — M25561 Pain in right knee: Secondary | ICD-10-CM | POA: Diagnosis not present

## 2019-08-15 DIAGNOSIS — M25561 Pain in right knee: Secondary | ICD-10-CM | POA: Diagnosis not present

## 2019-08-17 DIAGNOSIS — M25561 Pain in right knee: Secondary | ICD-10-CM | POA: Diagnosis not present

## 2019-08-22 DIAGNOSIS — M25561 Pain in right knee: Secondary | ICD-10-CM | POA: Diagnosis not present

## 2019-08-29 DIAGNOSIS — M25561 Pain in right knee: Secondary | ICD-10-CM | POA: Diagnosis not present

## 2019-08-31 DIAGNOSIS — M25561 Pain in right knee: Secondary | ICD-10-CM | POA: Diagnosis not present

## 2019-09-03 DIAGNOSIS — M25561 Pain in right knee: Secondary | ICD-10-CM | POA: Diagnosis not present

## 2020-01-02 DIAGNOSIS — Z1231 Encounter for screening mammogram for malignant neoplasm of breast: Secondary | ICD-10-CM | POA: Diagnosis not present

## 2020-01-25 DIAGNOSIS — R0981 Nasal congestion: Secondary | ICD-10-CM | POA: Diagnosis not present

## 2020-02-19 ENCOUNTER — Encounter: Payer: Self-pay | Admitting: Physician Assistant

## 2020-02-19 ENCOUNTER — Other Ambulatory Visit: Payer: Self-pay

## 2020-02-19 ENCOUNTER — Ambulatory Visit: Payer: Medicare PPO | Admitting: Physician Assistant

## 2020-02-19 DIAGNOSIS — L570 Actinic keratosis: Secondary | ICD-10-CM

## 2020-02-19 DIAGNOSIS — L578 Other skin changes due to chronic exposure to nonionizing radiation: Secondary | ICD-10-CM | POA: Diagnosis not present

## 2020-02-19 DIAGNOSIS — Z1283 Encounter for screening for malignant neoplasm of skin: Secondary | ICD-10-CM

## 2020-02-19 MED ORDER — TOLAK 4 % EX CREA: 1.0000 "application " | TOPICAL_CREAM | Freq: Every day | CUTANEOUS | 1 refills | Status: DC

## 2020-02-20 ENCOUNTER — Telehealth: Payer: Self-pay | Admitting: *Deleted

## 2020-02-20 MED ORDER — TOLAK 4 % EX CREA: 1.0000 "application " | TOPICAL_CREAM | Freq: Every day | CUTANEOUS | 1 refills | Status: AC

## 2020-02-20 NOTE — Telephone Encounter (Signed)
Phone call from [place] blue they cant get Tolak cream so I sent prescription to [facility]

## 2020-02-20 NOTE — Progress Notes (Signed)
° °  Follow-Up Visit   Subjective  Gloria Wise is a 69 y.o. female who presents for the following: Annual Exam (NO CONCERNS).   The following portions of the chart were reviewed this encounter and updated as appropriate: Tobacco   Allergies   Meds   Problems   Med Hx   Surg Hx   Fam Hx       Objective  Well appearing patient in no apparent distress; mood and affect are within normal limits.  A full examination was performed including scalp, head, eyes, ears, nose, lips, neck, chest, axillae, abdomen, back, buttocks, bilateral upper extremities, bilateral lower extremities, hands, feet, fingers, toes, fingernails, and toenails. All findings within normal limits unless otherwise noted below.  Objective  Left Eyebrow, Left Hand - Posterior, Left Zygomatic Area (2), Right Breast, Right Eyebrow, Right Hand - Posterior: Erythematous patches with gritty scale.  Objective  Head - to toe: No atypical nevi No signs of non-mole skin cancer.   Objective  chest: Diffuse scale   Assessment & Plan  AK (actinic keratosis) (7) Left Hand - Posterior; Right Hand - Posterior; Right Breast; Left Eyebrow; Right Eyebrow; Left Zygomatic Area (2)  Destruction of lesion - Left Eyebrow, Left Hand - Posterior, Left Zygomatic Area (2), Right Breast, Right Eyebrow, Right Hand - Posterior Complexity: simple   Destruction method: cryotherapy   Informed consent: discussed and consent obtained   Timeout:  patient name, date of birth, surgical site, and procedure verified Lesion destroyed using liquid nitrogen: Yes   Cryotherapy cycles:  1 Outcome: patient tolerated procedure well with no complications   Post-procedure details: wound care instructions given    Screening exam for skin cancer Head - to toe  Yearly skin exams  Actinic skin damage chest  Ordered medications: tolak 4% cream    I, Trynity Skousen, PA-C, have reviewed all documentation's for this visit.  The documentation on 02/20/20 for  the exam, diagnosis, procedures and orders are all accurate and complete.

## 2020-04-15 DIAGNOSIS — H43813 Vitreous degeneration, bilateral: Secondary | ICD-10-CM | POA: Diagnosis not present

## 2020-04-15 DIAGNOSIS — H2513 Age-related nuclear cataract, bilateral: Secondary | ICD-10-CM | POA: Diagnosis not present

## 2020-04-21 DIAGNOSIS — J0110 Acute frontal sinusitis, unspecified: Secondary | ICD-10-CM | POA: Diagnosis not present

## 2020-04-21 DIAGNOSIS — H66003 Acute suppurative otitis media without spontaneous rupture of ear drum, bilateral: Secondary | ICD-10-CM | POA: Diagnosis not present

## 2020-06-30 DIAGNOSIS — Z0001 Encounter for general adult medical examination with abnormal findings: Secondary | ICD-10-CM | POA: Diagnosis not present

## 2020-06-30 DIAGNOSIS — E559 Vitamin D deficiency, unspecified: Secondary | ICD-10-CM | POA: Diagnosis not present

## 2020-07-04 DIAGNOSIS — M8580 Other specified disorders of bone density and structure, unspecified site: Secondary | ICD-10-CM | POA: Diagnosis not present

## 2020-07-04 DIAGNOSIS — Z23 Encounter for immunization: Secondary | ICD-10-CM | POA: Diagnosis not present

## 2020-07-04 DIAGNOSIS — Z0000 Encounter for general adult medical examination without abnormal findings: Secondary | ICD-10-CM | POA: Diagnosis not present

## 2020-07-04 DIAGNOSIS — J309 Allergic rhinitis, unspecified: Secondary | ICD-10-CM | POA: Diagnosis not present

## 2020-08-05 DIAGNOSIS — M810 Age-related osteoporosis without current pathological fracture: Secondary | ICD-10-CM | POA: Diagnosis not present

## 2020-12-09 DIAGNOSIS — Z01419 Encounter for gynecological examination (general) (routine) without abnormal findings: Secondary | ICD-10-CM | POA: Diagnosis not present

## 2021-01-20 DIAGNOSIS — Z1231 Encounter for screening mammogram for malignant neoplasm of breast: Secondary | ICD-10-CM | POA: Diagnosis not present

## 2021-02-19 DIAGNOSIS — Z1211 Encounter for screening for malignant neoplasm of colon: Secondary | ICD-10-CM | POA: Diagnosis not present

## 2021-02-19 DIAGNOSIS — Z800 Family history of malignant neoplasm of digestive organs: Secondary | ICD-10-CM | POA: Diagnosis not present

## 2021-02-19 DIAGNOSIS — Z8371 Family history of colonic polyps: Secondary | ICD-10-CM | POA: Diagnosis not present

## 2021-04-14 DIAGNOSIS — J3489 Other specified disorders of nose and nasal sinuses: Secondary | ICD-10-CM | POA: Diagnosis not present

## 2021-04-14 DIAGNOSIS — H938X3 Other specified disorders of ear, bilateral: Secondary | ICD-10-CM | POA: Diagnosis not present

## 2021-05-28 ENCOUNTER — Ambulatory Visit: Payer: Medicare PPO | Admitting: Physician Assistant

## 2021-05-28 ENCOUNTER — Other Ambulatory Visit: Payer: Self-pay

## 2021-05-28 DIAGNOSIS — L570 Actinic keratosis: Secondary | ICD-10-CM

## 2021-05-28 DIAGNOSIS — Z1283 Encounter for screening for malignant neoplasm of skin: Secondary | ICD-10-CM | POA: Diagnosis not present

## 2021-05-28 DIAGNOSIS — Z85828 Personal history of other malignant neoplasm of skin: Secondary | ICD-10-CM | POA: Diagnosis not present

## 2021-06-01 ENCOUNTER — Encounter: Payer: Self-pay | Admitting: Physician Assistant

## 2021-06-01 NOTE — Progress Notes (Signed)
° °  Follow-Up Visit   Subjective  Gloria Wise is a 71 y.o. female who presents for the following: Annual Exam (Here for annual skin exams. Patient was supposed to do 5FU treatment on her face last year and she did not get to it. Ozzie Hoyle of non mole skin cancers. ).   The following portions of the chart were reviewed this encounter and updated as appropriate:  Tobacco   Allergies   Meds   Problems   Med Hx   Surg Hx   Fam Hx       Objective  Well appearing patient in no apparent distress; mood and affect are within normal limits.  A full examination was performed including scalp, head, eyes, ears, nose, lips, neck, chest, axillae, abdomen, back, buttocks, bilateral upper extremities, bilateral lower extremities, hands, feet, fingers, toes, fingernails, and toenails. All findings within normal limits unless otherwise noted below.  Left Dorsal Hand, Left Nasal Sidewall, Left Zygomatic Area (2), Right Dorsal Hand (2), Right Nasal Sidewall Erythematous patches with gritty scale.   Assessment & Plan  AK (actinic keratosis) (7) Left Dorsal Hand; Right Dorsal Hand (2); Left Nasal Sidewall; Right Nasal Sidewall; Left Zygomatic Area (2)  Destruction of lesion - Left Dorsal Hand, Left Nasal Sidewall, Left Zygomatic Area, Right Dorsal Hand, Right Nasal Sidewall Complexity: simple   Destruction method: cryotherapy   Informed consent: discussed and consent obtained   Timeout:  patient name, date of birth, surgical site, and procedure verified Lesion destroyed using liquid nitrogen: Yes   Cryotherapy cycles:  3 Outcome: patient tolerated procedure well with no complications      I, Kariyah Baugh, PA-C, have reviewed all documentation's for this visit.  The documentation on 06/01/21 for the exam, diagnosis, procedures and orders are all accurate and complete.

## 2021-06-15 DIAGNOSIS — K1370 Unspecified lesions of oral mucosa: Secondary | ICD-10-CM | POA: Diagnosis not present

## 2021-06-16 DIAGNOSIS — H43813 Vitreous degeneration, bilateral: Secondary | ICD-10-CM | POA: Diagnosis not present

## 2021-06-16 DIAGNOSIS — H43823 Vitreomacular adhesion, bilateral: Secondary | ICD-10-CM | POA: Diagnosis not present

## 2021-06-16 DIAGNOSIS — H2513 Age-related nuclear cataract, bilateral: Secondary | ICD-10-CM | POA: Diagnosis not present

## 2021-07-01 DIAGNOSIS — M810 Age-related osteoporosis without current pathological fracture: Secondary | ICD-10-CM | POA: Diagnosis not present

## 2021-07-01 DIAGNOSIS — Z0001 Encounter for general adult medical examination with abnormal findings: Secondary | ICD-10-CM | POA: Diagnosis not present

## 2021-07-08 DIAGNOSIS — Z0001 Encounter for general adult medical examination with abnormal findings: Secondary | ICD-10-CM | POA: Diagnosis not present

## 2021-07-08 DIAGNOSIS — Z0000 Encounter for general adult medical examination without abnormal findings: Secondary | ICD-10-CM | POA: Diagnosis not present

## 2021-07-08 DIAGNOSIS — M810 Age-related osteoporosis without current pathological fracture: Secondary | ICD-10-CM | POA: Diagnosis not present

## 2021-07-08 DIAGNOSIS — M722 Plantar fascial fibromatosis: Secondary | ICD-10-CM | POA: Diagnosis not present

## 2021-07-08 DIAGNOSIS — Z882 Allergy status to sulfonamides status: Secondary | ICD-10-CM | POA: Diagnosis not present

## 2021-07-08 DIAGNOSIS — J309 Allergic rhinitis, unspecified: Secondary | ICD-10-CM | POA: Diagnosis not present

## 2021-07-08 DIAGNOSIS — E559 Vitamin D deficiency, unspecified: Secondary | ICD-10-CM | POA: Diagnosis not present

## 2021-07-08 DIAGNOSIS — Z85828 Personal history of other malignant neoplasm of skin: Secondary | ICD-10-CM | POA: Diagnosis not present

## 2021-07-08 DIAGNOSIS — Z8719 Personal history of other diseases of the digestive system: Secondary | ICD-10-CM | POA: Diagnosis not present

## 2021-07-09 ENCOUNTER — Other Ambulatory Visit: Payer: Self-pay | Admitting: Internal Medicine

## 2021-07-09 DIAGNOSIS — R0989 Other specified symptoms and signs involving the circulatory and respiratory systems: Secondary | ICD-10-CM

## 2021-08-06 ENCOUNTER — Ambulatory Visit
Admission: RE | Admit: 2021-08-06 | Discharge: 2021-08-06 | Disposition: A | Discharge status: Home / Self Care | Payer: Medicare PPO | Source: Ambulatory Visit | Attending: Internal Medicine | Admitting: Internal Medicine

## 2021-08-20 DIAGNOSIS — Z1151 Encounter for screening for human papillomavirus (HPV): Secondary | ICD-10-CM | POA: Diagnosis not present

## 2021-08-20 DIAGNOSIS — Z124 Encounter for screening for malignant neoplasm of cervix: Secondary | ICD-10-CM | POA: Diagnosis not present

## 2021-08-26 DIAGNOSIS — R918 Other nonspecific abnormal finding of lung field: Secondary | ICD-10-CM | POA: Diagnosis not present

## 2021-08-26 DIAGNOSIS — R3589 Other polyuria: Secondary | ICD-10-CM | POA: Diagnosis not present

## 2022-01-26 DIAGNOSIS — Z1231 Encounter for screening mammogram for malignant neoplasm of breast: Secondary | ICD-10-CM | POA: Diagnosis not present

## 2022-05-31 ENCOUNTER — Ambulatory Visit: Payer: Medicare PPO | Admitting: Dermatology

## 2022-06-15 ENCOUNTER — Ambulatory Visit: Payer: Medicare PPO | Admitting: Physician Assistant

## 2022-06-22 DIAGNOSIS — H43813 Vitreous degeneration, bilateral: Secondary | ICD-10-CM | POA: Diagnosis not present

## 2022-06-22 DIAGNOSIS — H43823 Vitreomacular adhesion, bilateral: Secondary | ICD-10-CM | POA: Diagnosis not present

## 2022-06-22 DIAGNOSIS — H2513 Age-related nuclear cataract, bilateral: Secondary | ICD-10-CM | POA: Diagnosis not present

## 2022-06-22 DIAGNOSIS — H5203 Hypermetropia, bilateral: Secondary | ICD-10-CM | POA: Diagnosis not present

## 2022-07-08 DIAGNOSIS — Z0001 Encounter for general adult medical examination with abnormal findings: Secondary | ICD-10-CM | POA: Diagnosis not present

## 2022-07-13 ENCOUNTER — Other Ambulatory Visit: Payer: Self-pay | Admitting: Internal Medicine

## 2022-07-13 DIAGNOSIS — M25842 Other specified joint disorders, left hand: Secondary | ICD-10-CM | POA: Diagnosis not present

## 2022-07-13 DIAGNOSIS — R911 Solitary pulmonary nodule: Secondary | ICD-10-CM | POA: Diagnosis not present

## 2022-07-13 DIAGNOSIS — M810 Age-related osteoporosis without current pathological fracture: Secondary | ICD-10-CM | POA: Diagnosis not present

## 2022-07-13 DIAGNOSIS — H938X3 Other specified disorders of ear, bilateral: Secondary | ICD-10-CM | POA: Diagnosis not present

## 2022-07-13 DIAGNOSIS — Z0000 Encounter for general adult medical examination without abnormal findings: Secondary | ICD-10-CM | POA: Diagnosis not present

## 2022-07-19 DIAGNOSIS — M79642 Pain in left hand: Secondary | ICD-10-CM | POA: Diagnosis not present

## 2022-07-19 DIAGNOSIS — M8580 Other specified disorders of bone density and structure, unspecified site: Secondary | ICD-10-CM | POA: Diagnosis not present

## 2022-07-19 DIAGNOSIS — M79641 Pain in right hand: Secondary | ICD-10-CM | POA: Diagnosis not present

## 2022-07-19 DIAGNOSIS — M20009 Unspecified deformity of unspecified finger(s): Secondary | ICD-10-CM | POA: Diagnosis not present

## 2022-07-19 DIAGNOSIS — M79645 Pain in left finger(s): Secondary | ICD-10-CM | POA: Diagnosis not present

## 2022-07-19 DIAGNOSIS — M1990 Unspecified osteoarthritis, unspecified site: Secondary | ICD-10-CM | POA: Diagnosis not present

## 2022-08-10 ENCOUNTER — Other Ambulatory Visit: Payer: Medicare PPO

## 2022-08-23 DIAGNOSIS — L570 Actinic keratosis: Secondary | ICD-10-CM | POA: Diagnosis not present

## 2022-08-23 DIAGNOSIS — L720 Epidermal cyst: Secondary | ICD-10-CM | POA: Diagnosis not present

## 2022-08-23 DIAGNOSIS — D2271 Melanocytic nevi of right lower limb, including hip: Secondary | ICD-10-CM | POA: Diagnosis not present

## 2022-08-23 DIAGNOSIS — L821 Other seborrheic keratosis: Secondary | ICD-10-CM | POA: Diagnosis not present

## 2022-08-23 DIAGNOSIS — D225 Melanocytic nevi of trunk: Secondary | ICD-10-CM | POA: Diagnosis not present

## 2022-08-23 DIAGNOSIS — L723 Sebaceous cyst: Secondary | ICD-10-CM | POA: Diagnosis not present

## 2022-08-23 DIAGNOSIS — L578 Other skin changes due to chronic exposure to nonionizing radiation: Secondary | ICD-10-CM | POA: Diagnosis not present

## 2022-08-24 DIAGNOSIS — E559 Vitamin D deficiency, unspecified: Secondary | ICD-10-CM | POA: Diagnosis not present

## 2022-09-07 ENCOUNTER — Ambulatory Visit
Admission: RE | Admit: 2022-09-07 | Discharge: 2022-09-07 | Disposition: A | Discharge status: Home / Self Care | Payer: Medicare PPO | Source: Ambulatory Visit | Attending: Internal Medicine | Admitting: Internal Medicine

## 2022-09-07 DIAGNOSIS — R911 Solitary pulmonary nodule: Secondary | ICD-10-CM

## 2022-09-07 DIAGNOSIS — I700 Atherosclerosis of aorta: Secondary | ICD-10-CM | POA: Diagnosis not present

## 2022-09-07 DIAGNOSIS — R918 Other nonspecific abnormal finding of lung field: Secondary | ICD-10-CM | POA: Diagnosis not present

## 2022-09-20 DIAGNOSIS — M542 Cervicalgia: Secondary | ICD-10-CM | POA: Diagnosis not present

## 2022-09-22 DIAGNOSIS — R918 Other nonspecific abnormal finding of lung field: Secondary | ICD-10-CM | POA: Diagnosis not present

## 2022-09-22 DIAGNOSIS — I700 Atherosclerosis of aorta: Secondary | ICD-10-CM | POA: Diagnosis not present

## 2022-09-22 DIAGNOSIS — N200 Calculus of kidney: Secondary | ICD-10-CM | POA: Diagnosis not present

## 2022-10-05 DIAGNOSIS — M542 Cervicalgia: Secondary | ICD-10-CM | POA: Diagnosis not present

## 2022-11-01 DIAGNOSIS — M542 Cervicalgia: Secondary | ICD-10-CM | POA: Diagnosis not present

## 2022-11-08 DIAGNOSIS — M542 Cervicalgia: Secondary | ICD-10-CM | POA: Diagnosis not present

## 2022-11-15 DIAGNOSIS — M542 Cervicalgia: Secondary | ICD-10-CM | POA: Diagnosis not present

## 2022-11-23 DIAGNOSIS — M542 Cervicalgia: Secondary | ICD-10-CM | POA: Diagnosis not present

## 2022-12-02 DIAGNOSIS — D2272 Melanocytic nevi of left lower limb, including hip: Secondary | ICD-10-CM | POA: Diagnosis not present

## 2022-12-02 DIAGNOSIS — L578 Other skin changes due to chronic exposure to nonionizing radiation: Secondary | ICD-10-CM | POA: Diagnosis not present

## 2022-12-02 DIAGNOSIS — D2262 Melanocytic nevi of left upper limb, including shoulder: Secondary | ICD-10-CM | POA: Diagnosis not present

## 2022-12-02 DIAGNOSIS — L570 Actinic keratosis: Secondary | ICD-10-CM | POA: Diagnosis not present

## 2022-12-02 DIAGNOSIS — L720 Epidermal cyst: Secondary | ICD-10-CM | POA: Diagnosis not present

## 2022-12-14 DIAGNOSIS — N951 Menopausal and female climacteric states: Secondary | ICD-10-CM | POA: Diagnosis not present

## 2022-12-14 DIAGNOSIS — Z01419 Encounter for gynecological examination (general) (routine) without abnormal findings: Secondary | ICD-10-CM | POA: Diagnosis not present

## 2023-01-19 DIAGNOSIS — L729 Follicular cyst of the skin and subcutaneous tissue, unspecified: Secondary | ICD-10-CM | POA: Diagnosis not present

## 2023-01-25 DIAGNOSIS — L720 Epidermal cyst: Secondary | ICD-10-CM | POA: Diagnosis not present

## 2023-02-01 DIAGNOSIS — Z1231 Encounter for screening mammogram for malignant neoplasm of breast: Secondary | ICD-10-CM | POA: Diagnosis not present

## 2023-04-04 DIAGNOSIS — Z2089 Contact with and (suspected) exposure to other communicable diseases: Secondary | ICD-10-CM | POA: Diagnosis not present

## 2023-04-04 DIAGNOSIS — R531 Weakness: Secondary | ICD-10-CM | POA: Diagnosis not present

## 2023-04-04 DIAGNOSIS — M7918 Myalgia, other site: Secondary | ICD-10-CM | POA: Diagnosis not present

## 2023-04-04 DIAGNOSIS — R0981 Nasal congestion: Secondary | ICD-10-CM | POA: Diagnosis not present

## 2023-04-04 DIAGNOSIS — R058 Other specified cough: Secondary | ICD-10-CM | POA: Diagnosis not present

## 2023-05-09 DIAGNOSIS — L570 Actinic keratosis: Secondary | ICD-10-CM | POA: Diagnosis not present

## 2023-05-31 DIAGNOSIS — J0110 Acute frontal sinusitis, unspecified: Secondary | ICD-10-CM | POA: Diagnosis not present

## 2023-07-12 DIAGNOSIS — M810 Age-related osteoporosis without current pathological fracture: Secondary | ICD-10-CM | POA: Diagnosis not present

## 2023-07-12 DIAGNOSIS — I700 Atherosclerosis of aorta: Secondary | ICD-10-CM | POA: Diagnosis not present

## 2023-07-18 DIAGNOSIS — M810 Age-related osteoporosis without current pathological fracture: Secondary | ICD-10-CM | POA: Diagnosis not present

## 2023-07-18 DIAGNOSIS — R911 Solitary pulmonary nodule: Secondary | ICD-10-CM | POA: Diagnosis not present

## 2023-07-18 DIAGNOSIS — I700 Atherosclerosis of aorta: Secondary | ICD-10-CM | POA: Diagnosis not present

## 2023-07-18 DIAGNOSIS — R3129 Other microscopic hematuria: Secondary | ICD-10-CM | POA: Diagnosis not present

## 2023-07-18 DIAGNOSIS — M1990 Unspecified osteoarthritis, unspecified site: Secondary | ICD-10-CM | POA: Diagnosis not present

## 2023-07-18 DIAGNOSIS — J309 Allergic rhinitis, unspecified: Secondary | ICD-10-CM | POA: Diagnosis not present

## 2023-07-18 DIAGNOSIS — Z0000 Encounter for general adult medical examination without abnormal findings: Secondary | ICD-10-CM | POA: Diagnosis not present

## 2023-08-08 DIAGNOSIS — L570 Actinic keratosis: Secondary | ICD-10-CM | POA: Diagnosis not present

## 2023-08-22 DIAGNOSIS — H5203 Hypermetropia, bilateral: Secondary | ICD-10-CM | POA: Diagnosis not present

## 2023-08-22 DIAGNOSIS — H52223 Regular astigmatism, bilateral: Secondary | ICD-10-CM | POA: Diagnosis not present

## 2023-08-22 DIAGNOSIS — H43823 Vitreomacular adhesion, bilateral: Secondary | ICD-10-CM | POA: Diagnosis not present

## 2023-08-22 DIAGNOSIS — H04123 Dry eye syndrome of bilateral lacrimal glands: Secondary | ICD-10-CM | POA: Diagnosis not present

## 2023-08-22 DIAGNOSIS — H524 Presbyopia: Secondary | ICD-10-CM | POA: Diagnosis not present

## 2023-08-22 DIAGNOSIS — H43813 Vitreous degeneration, bilateral: Secondary | ICD-10-CM | POA: Diagnosis not present

## 2023-08-22 DIAGNOSIS — H2513 Age-related nuclear cataract, bilateral: Secondary | ICD-10-CM | POA: Diagnosis not present

## 2023-09-02 DIAGNOSIS — M25532 Pain in left wrist: Secondary | ICD-10-CM | POA: Diagnosis not present

## 2023-09-27 ENCOUNTER — Emergency Department (HOSPITAL_COMMUNITY)
Admission: EM | Admit: 2023-09-27 | Discharge: 2023-09-27 | Disposition: A | Discharge status: Home / Self Care | Attending: Emergency Medicine | Admitting: Emergency Medicine

## 2023-09-27 ENCOUNTER — Emergency Department (HOSPITAL_COMMUNITY)

## 2023-09-27 ENCOUNTER — Encounter (HOSPITAL_COMMUNITY): Payer: Self-pay | Admitting: *Deleted

## 2023-09-27 ENCOUNTER — Other Ambulatory Visit: Payer: Self-pay

## 2023-09-27 DIAGNOSIS — Z85828 Personal history of other malignant neoplasm of skin: Secondary | ICD-10-CM | POA: Insufficient documentation

## 2023-09-27 DIAGNOSIS — N201 Calculus of ureter: Secondary | ICD-10-CM

## 2023-09-27 DIAGNOSIS — N200 Calculus of kidney: Secondary | ICD-10-CM

## 2023-09-27 DIAGNOSIS — D72829 Elevated white blood cell count, unspecified: Secondary | ICD-10-CM | POA: Insufficient documentation

## 2023-09-27 DIAGNOSIS — R109 Unspecified abdominal pain: Secondary | ICD-10-CM | POA: Diagnosis not present

## 2023-09-27 DIAGNOSIS — N202 Calculus of kidney with calculus of ureter: Secondary | ICD-10-CM | POA: Diagnosis not present

## 2023-09-27 DIAGNOSIS — Z91040 Latex allergy status: Secondary | ICD-10-CM | POA: Diagnosis not present

## 2023-09-27 LAB — BASIC METABOLIC PANEL WITH GFR
Anion gap: 9 (ref 5–15)
BUN: 14 mg/dL (ref 8–23)
CO2: 24 mmol/L (ref 22–32)
Calcium: 9 mg/dL (ref 8.9–10.3)
Chloride: 100 mmol/L (ref 98–111)
Creatinine, Ser: 0.89 mg/dL (ref 0.44–1.00)
GFR, Estimated: >60 mL/min (ref >60)
Glucose, Bld: 140 mg/dL — ABNORMAL HIGH (ref 70–99)
Potassium: 4 mmol/L (ref 3.5–5.1)
Sodium: 133 mmol/L — ABNORMAL LOW (ref 135–145)

## 2023-09-27 LAB — CBC WITH DIFFERENTIAL/PLATELET
Abs Immature Granulocytes: 0.03 10*3/uL (ref 0.00–0.07)
Basophils Absolute: 0 10*3/uL (ref 0.0–0.1)
Basophils Relative: 0 %
Eosinophils Absolute: 0 10*3/uL (ref 0.0–0.5)
Eosinophils Relative: 0 %
HCT: 38.5 % (ref 36.0–46.0)
Hemoglobin: 12.8 g/dL (ref 12.0–15.0)
Immature Granulocytes: 0 %
Lymphocytes Relative: 7 %
Lymphs Abs: 0.8 10*3/uL (ref 0.7–4.0)
MCH: 31.4 pg (ref 26.0–34.0)
MCHC: 33.2 g/dL (ref 30.0–36.0)
MCV: 94.4 fL (ref 80.0–100.0)
Monocytes Absolute: 0.5 10*3/uL (ref 0.1–1.0)
Monocytes Relative: 4 %
Neutro Abs: 10.4 10*3/uL — ABNORMAL HIGH (ref 1.7–7.7)
Neutrophils Relative %: 89 %
Platelets: 343 10*3/uL (ref 150–400)
RBC: 4.08 MIL/uL (ref 3.87–5.11)
RDW: 12.4 % (ref 11.5–15.5)
WBC: 11.8 10*3/uL — ABNORMAL HIGH (ref 4.0–10.5)
nRBC: 0 % (ref 0.0–0.2)

## 2023-09-27 LAB — URINALYSIS, ROUTINE W REFLEX MICROSCOPIC
Bacteria, UA: NONE SEEN
Bilirubin Urine: NEGATIVE
Glucose, UA: NEGATIVE mg/dL
Ketones, ur: 5 mg/dL — AB
Nitrite: NEGATIVE
Protein, ur: 30 mg/dL — AB
RBC / HPF: >50 RBC/hpf (ref 0–5)
Specific Gravity, Urine: 1.016 (ref 1.005–1.030)
pH: 6 (ref 5.0–8.0)

## 2023-09-27 MED ORDER — KETOROLAC TROMETHAMINE 15 MG/ML IJ SOLN
15.0000 mg | Freq: Once | INTRAMUSCULAR | Status: AC
  Administered 2023-09-27: 15 mg via INTRAVENOUS
  Filled 2023-09-27: qty 1

## 2023-09-27 MED ORDER — ONDANSETRON HCL 4 MG/2ML IJ SOLN
4.0000 mg | Freq: Once | INTRAMUSCULAR | Status: AC
  Administered 2023-09-27: 4 mg via INTRAVENOUS
  Filled 2023-09-27: qty 2

## 2023-09-27 MED ORDER — HYDROCODONE-ACETAMINOPHEN 5-325 MG PO TABS: 1.0000 | ORAL_TABLET | Freq: Four times a day (QID) | ORAL | 0 refills | Status: AC | PRN

## 2023-09-27 MED ORDER — SODIUM CHLORIDE 0.9 % IV BOLUS
1000.0000 mL | Freq: Once | INTRAVENOUS | Status: AC
  Administered 2023-09-27: 1000 mL via INTRAVENOUS

## 2023-09-27 MED ORDER — ONDANSETRON 4 MG PO TBDP: 4.0000 mg | ORAL_TABLET | Freq: Three times a day (TID) | ORAL | 0 refills | Status: AC | PRN

## 2023-09-27 NOTE — ED Triage Notes (Signed)
 Pt is c/o left flank pain that radiates around to waist  Pt states the pain has been intermittent since Sunday  Pt has had one episode of vomiting

## 2023-09-27 NOTE — ED Provider Notes (Signed)
 [place] EMERGENCY DEPARTMENT AT [facility] Provider Note   CSN: [id number] Arrival date & time: 09/27/23  0747     History  Chief Complaint  Patient presents with   Flank Pain    Gloria Wise is a 73 y.o. female.  The history is provided by the patient.  Patient with history of anemia and GERD presents with left flank pain.  She reports her pain started over 2 days ago.  She woke up with the pain and thought it was related to holding her granddaughter.  No recent falls or trauma.  The pain resolved, but has since returned and radiates to her left abdomen.  She had an episode of nonbloody emesis.  No change in her bowel movements.  No chest pain or shortness of breath.  She denies any dysuria     Past Medical History:  Diagnosis Date   Anemia    after first child   Arthritis    Cancer (HCC)    Basal cell on face   Family history of adverse reaction to anesthesia    mother had N/V   GERD (gastroesophageal reflux disease)    Pneumonia     Home Medications Prior to Admission medications   Medication Sig Start Date End Date Taking? Authorizing Provider  HYDROcodone-acetaminophen  (NORCO/VICODIN) 5-325 MG tablet Take 1 tablet by mouth every 6 (six) hours as needed. 09/27/23  Yes Eldon Greenland, MD  ondansetron  (ZOFRAN -ODT) 4 MG disintegrating tablet Take 1 tablet (4 mg total) by mouth every 8 (eight) hours as needed. 09/27/23  Yes Eldon Greenland, MD  acetaminophen  (TYLENOL ) 500 MG tablet Take 500-1,000 mg by mouth every 6 (six) hours as needed for moderate pain or headache.    [provider]  ARTIFICIAL TEAR SOLUTION OP Place 1 drop into both eyes daily as needed (dry eyes).    [provider]  Ascorbic Acid  (VITAMIN C  PO) Take 2 tablets by mouth daily as needed (immune support).    [provider]  Black Elderberry (SAMBUCUS ELDERBERRY PO) Take 2 tablets by mouth daily.    [provider]  Calcium Citrate-Vitamin D (CALCIUM + D  PO) Take 3 tablets by mouth daily.    [provider]  cholecalciferol (VITAMIN D3) 25 MCG (1000 UNIT) tablet 1 tablet    [provider]  cholecalciferol (VITAMIN D3) 25 MCG (1000 UT) tablet Take 1,000 Units by mouth daily.    [provider]  Diclofenac Sodium 2 % SOLN Pennsaid 20 mg/gram/actuation (2 %) topical soln in metered-dose pump  APPLY 2 PUMPS (40 MG) TO THE AFFECTED AREA BY TOPICAL ROUTE 2 TIMES PER DAY    [provider]  famotidine  (PEPCID ) 20 MG tablet Take 20 mg by mouth daily as needed for heartburn or indigestion.    [provider]  Fluorouracil  (TOLAK ) 4 % CREA Apply 1 application topically at bedtime. Apply to affected area qhs x 2 weeks 02/20/20   Sheffield, Kelli R, PA-C  fluticasone (FLONASE) 50 MCG/ACT nasal spray 1 spray in each nostril 01/25/20   [provider]  ibuprofen (ADVIL) 200 MG tablet ibuprofen 200 mg tablet   400 mg by oral route.    [provider]  Glynda Lash Calcium 500 MG TABS 1 tablet with meals    [provider]  pseudoephedrine (SUDAFED) 30 MG tablet 2 tablets as needed 01/25/20   [provider]  Psyllium Husk POWD Take 1 Dose by mouth daily.  [provider]  sodium chloride  (OCEAN) 0.65 % SOLN nasal spray Place 1 spray into both nostrils as needed for congestion.    [provider]  triamcinolone  cream (KENALOG) 0.1 % Apply 1 application topically daily as needed (irritation).    [provider]      Allergies    Latex, Penicillins, and Sulfa antibiotics    Review of Systems   Review of Systems  Constitutional:  Negative for fever.  Respiratory:  Negative for shortness of breath.   Cardiovascular:  Negative for chest pain.  Genitourinary:  Positive for flank pain. Negative for dysuria.  Neurological:  Negative for weakness and numbness.    Physical Exam Updated Vital Signs BP 132/64 (BP Location: Right Arm)   Pulse 76   Temp 98.1  F (36.7 C) (Oral)   Resp 19   Ht 1.702 m (5\' 7" )   Wt 67.6 kg   SpO2 98%   BMI 23.34 kg/m  Physical Exam CONSTITUTIONAL: Elderly, no acute distress HEAD: Normocephalic/atraumatic EYES: EOMI/PERRL ENMT: Mucous membranes moist NECK: supple no meningeal signs SPINE/BACK:entire spine nontender CV: S1/S2 noted, no murmurs/rubs/gallops noted LUNGS: Lungs are clear to auscultation bilaterally, no apparent distress ABDOMEN: soft, mild left lower quadrant tenderness, no rebound or guarding, bowel sounds noted throughout abdomen GU: Left cva tenderness NEURO: Pt is awake/alert/appropriate, moves all extremitiesx4.  No facial droop.  Equal power noted bilateral lower extremity EXTREMITIES: pulses normal/equal, full ROM SKIN: warm, color normal, no rash to her back PSYCH: no abnormalities of mood noted, alert and oriented to situation  ED Results / Procedures / Treatments   Labs (all labs ordered are listed, but only abnormal results are displayed) Labs Reviewed  CBC WITH DIFFERENTIAL/PLATELET - Abnormal; Notable for the following components:      Result Value   WBC 11.8 (*)    Neutro Abs 10.4 (*)    All other components within normal limits  BASIC METABOLIC PANEL WITH GFR - Abnormal; Notable for the following components:   Sodium 133 (*)    Glucose, Bld 140 (*)    All other components within normal limits  URINALYSIS, ROUTINE W REFLEX MICROSCOPIC - Abnormal; Notable for the following components:   Hgb urine dipstick MODERATE (*)    Ketones, ur 5 (*)    Protein, ur 30 (*)    Leukocytes,Ua TRACE (*)    All other components within normal limits    EKG None  Radiology DG Abdomen 1 View Result Date: 09/27/2023 CLINICAL DATA:  Flank pain, possible stone EXAM: ABDOMEN - 1 VIEW COMPARISON:  None Available. FINDINGS: A 10 mm calcification is present overlying the left lower abdomen medially which is annotated. Nonspecific bowel gas pattern. Left convex lumbar curvature with degenerative  changes in the lumbar spine. IMPRESSION: 1. A 10 mm calcification is present overlying the left abdomen which may represent a proximal ureteral stone. Electronically Signed   By: Reagan Camera M.D.   On: 09/27/2023 08:44    Procedures Procedures    Medications Ordered in ED Medications  ondansetron  (ZOFRAN ) injection 4 mg (4 mg Intravenous Given 09/27/23 0954)  sodium chloride  0.9 % bolus 1,000 mL (1,000 mLs Intravenous Bolus 09/27/23 0953)  ketorolac (TORADOL) 15 MG/ML injection 15 mg (15 mg Intravenous Given 09/27/23 0954)    ED Course/ Medical Decision Making/ A&P Clinical Course as of 09/27/23 1120  Tue Sep 27, 2023  1117 Patient presented with left flank pain that rating to her abdomen.  Previous imaging has indicated she  had a kidney stone.  Patient with hematuria, and screening KUB revealed likely ureteral stone, patient declined CT imaging [DW]  1117 Overall patient is improved.  No signs of infectious etiology.  She has no new pain complaints.  Will discharge home with presumed ureteral stone and advise close urology follow-up.  Will defer medical expulsive therapy as patient is had low blood pressures here in the ER  Short course of pain medicine and antiemetics.  We discussed strict return precautions [DW]    Clinical Course User Index [DW] Eldon Greenland, MD                                 Medical Decision Making Amount and/or Complexity of Data Reviewed Labs: ordered. Radiology: ordered.  Risk Prescription drug management.   This patient presents to the ED for concern of flank pain, this involves an extensive number of treatment options, and is a complaint that carries with it a high risk of complications and morbidity.  The differential diagnosis includes but is not limited to ureteral stone, pyelonephritis, muscle strain, discitis, epidural abscess, AAA, herpes zoster  Comorbidities that complicate the patient evaluation: Patient's presentation is complicated by their  history of anemia  Additional history obtained: Records reviewed  previously radiology results reveal left kidney stone  Lab Tests: I Ordered, and personally interpreted labs.  The pertinent results include: Hematuria, mild leukocytosis  Imaging Studies ordered: I ordered imaging studies including X-ray KUB    I independently visualized and interpreted imaging which showed ureteral stone I agree with the radiologist interpretation  Medicines ordered and prescription drug management: I ordered medication including Toradol for pain Reevaluation of the patient after these medicines showed that the patient    improved  Test Considered: I considered CT imaging, but patient declined further workup and is feeling improved  Reevaluation: After the interventions noted above, I reevaluated the patient and found that they have :improved  Complexity of problems addressed: Patient's presentation is most consistent with  acute presentation with potential threat to life or bodily function  Disposition: After consideration of the diagnostic results and the patient's response to treatment,  I feel that the patent would benefit from discharge   .           Final Clinical Impression(s) / ED Diagnoses Final diagnoses:  Ureteral stone  Kidney stone    Rx / DC Orders ED Discharge Orders          Ordered    ondansetron  (ZOFRAN -ODT) 4 MG disintegrating tablet  Every 8 hours PRN        09/27/23 1119    HYDROcodone-acetaminophen  (NORCO/VICODIN) 5-325 MG tablet  Every 6 hours PRN        09/27/23 1119              Eldon Greenland, MD 09/27/23 1120

## 2023-09-30 DIAGNOSIS — R1084 Generalized abdominal pain: Secondary | ICD-10-CM | POA: Diagnosis not present

## 2023-09-30 DIAGNOSIS — N201 Calculus of ureter: Secondary | ICD-10-CM | POA: Diagnosis not present

## 2023-10-03 DIAGNOSIS — N132 Hydronephrosis with renal and ureteral calculous obstruction: Secondary | ICD-10-CM | POA: Diagnosis not present

## 2023-10-03 DIAGNOSIS — K5730 Diverticulosis of large intestine without perforation or abscess without bleeding: Secondary | ICD-10-CM | POA: Diagnosis not present

## 2023-10-03 DIAGNOSIS — R1084 Generalized abdominal pain: Secondary | ICD-10-CM | POA: Diagnosis not present

## 2023-10-03 DIAGNOSIS — Z9049 Acquired absence of other specified parts of digestive tract: Secondary | ICD-10-CM | POA: Diagnosis not present

## 2023-10-04 ENCOUNTER — Other Ambulatory Visit: Payer: Self-pay | Admitting: Urology

## 2023-10-05 ENCOUNTER — Encounter (HOSPITAL_COMMUNITY): Payer: Self-pay | Admitting: Urology

## 2023-10-05 NOTE — Progress Notes (Signed)
 Spoke w/ via phone for pre-op interview--- Lab needs dos---KUB         Lab results------ COVID test --Not indicated--patient states asymptomatic no test needed Arrive at ----0945 NPO after MN  Pre-Surgery Ensure or G2:  Med rec completed Medications to take morning of surgery ----If needed, Norco, Zofran , Flonase, Pepcid - Diabetic medication -----  GLP1 agonist last dose: GLP1 instructions:  Patient instructed no nail polish to be worn day of surgery Patient instructed to bring photo id and insurance card day of surgery Patient aware to have Driver (ride ) / caregiver    for 24 hours after surgery - friend- Andres Kea [phone] Patient Special Instructions ----- Pre-Op special Instructions -----Stop all vitamins/supplements and aspirin/NSAIDS now. Laxative of choice Thursday, hydrate well and eat light dinner, other per [facility]  Patient verbalized understanding of instructions that were given at this phone interview. Patient denies chest pain, sob, fever, cough at the interview.

## 2023-10-06 NOTE — H&P (Signed)
 Office Visit Report     09/30/2023   --------------------------------------------------------------------------------   Gloria Wise  MRN: [id number]  DOB: Aug 10, 1950, 73 year old Female  SSN:    PRIMARY CARE:  Imelda Man, MD  PRIMARY CARE FAX:  [phone]  REFERRING:    PROVIDER:  Cleophus Dade. Jarvis Mesa, M.D.  LOCATION:  [facility] [phone]     --------------------------------------------------------------------------------   CC/HPI: 1. possible left ureteral stone -patient presented to the emergency department several days ago complaining of acute left flank pain. KUB was done which shows a possible calcification in the proximal left ureter. Patient declined a CT scan at that time. Urine was positive for RBCs.   Patient is not had a stone before. Her symptoms have improved since that emergency department visit. She has taken her Norco prescription maybe 2 or 3 times.   I found an old CT chest from last year that does show a nonobstructing lower pole stone on the left.     ALLERGIES: Penicillin - Vomiting Sulfa - Skin Rash    MEDICATIONS: ZyrTEC  Airborne Gummies  Flonase Allergy Relief  Plant Based Calcium  Psyllium Husk Powder  Vitamin D3     GU PSH: None   NON-GU PSH: Appendectomy - 2004 Remove Gallbladder - 2006 Tonsillectomy - 1960 Wrist Arthroscopy/surgery - 2020     GU PMH: None     PMH Notes: History of kidney stones   NON-GU PMH: None   FAMILY HISTORY: 2 sons - Runs in Family Lung Cancer - Mother, Father   SOCIAL HISTORY: Marital Status: Widowed Preferred Language: English; Ethnicity: Not Hispanic Or Latino; Race: White Current Smoking Status: Patient has never smoked.   Tobacco Use Assessment Completed: Used Tobacco in last 30 days? Does not use smokeless tobacco. Social Drinker.  Does not use drugs. Drinks 1 caffeinated drink per day. Has not had a blood transfusion.    REVIEW OF SYSTEMS:    GU Review Female:    Patient denies frequent urination, hard to postpone urination, burning /pain with urination, get up at night to urinate, leakage of urine, stream starts and stops, trouble starting your stream, have to strain to urinate, and being pregnant.  Gastrointestinal (Upper):   Patient denies nausea, vomiting, and indigestion/ heartburn.  Gastrointestinal (Lower):   Patient denies diarrhea and constipation.  Constitutional:   Patient denies fever, night sweats, weight loss, and fatigue.  Skin:   Patient denies skin rash/ lesion and itching.  Eyes:   Patient denies blurred vision and double vision.  Ears/ Nose/ Throat:   Patient reports sinus problems. Patient denies sore throat.  Hematologic/Lymphatic:   Patient reports easy bruising. Patient denies swollen glands.  Cardiovascular:   Patient denies leg swelling and chest pains.  Respiratory:   Patient denies cough and shortness of breath.  Endocrine:   Patient denies excessive thirst.  Musculoskeletal:   Patient reports back pain. Patient denies joint pain.  Neurological:   Patient denies headaches and dizziness.  Psychologic:   Patient denies depression and anxiety.   VITAL SIGNS:      09/30/2023 10:17 AM  Weight 147 lb / 66.68 kg  Height 67 in / 170.18 cm  BP 115/76 mmHg  Pulse 81 /min  Temperature 97.9 F / 36.6 C  BMI 23.0 kg/m   MULTI-SYSTEM PHYSICAL EXAMINATION:    Constitutional: Well-nourished. No physical deformities. Normally developed. Good grooming.  Neck: Neck symmetrical, not swollen. Normal tracheal position.  Respiratory: No labored breathing, no use of  accessory muscles.   Cardiovascular: Normal temperature, normal extremity pulses, no swelling, no varicosities.  Lymphatic: No enlargement of neck, axillae, groin.  Skin: No paleness, no jaundice, no cyanosis. No lesion, no ulcer, no rash.  Neurologic / Psychiatric: Oriented to time, oriented to place, oriented to person. No depression, no anxiety, no agitation.   Gastrointestinal: No mass, no tenderness, no rigidity, non obese abdomen.  Eyes: Normal conjunctivae. Normal eyelids.  Ears, Nose, Mouth, and Throat: Left ear no scars, no lesions, no masses. Right ear no scars, no lesions, no masses. Nose no scars, no lesions, no masses. Normal hearing. Normal lips.  Musculoskeletal: Normal gait and station of head and neck.     Complexity of Data:  Source Of History:  Patient, Medical Record Summary  Records Review:   Previous Doctor Records, Previous Patient Records  Urine Test Review:   Urinalysis  X-Ray Review: Outside X-Ray: Reviewed Films. Reviewed Report. Discussed With Patient.     PROCEDURES:          Urinalysis w/Scope Dipstick Dipstick Cont'd Micro  Color: Yellow Bilirubin: Neg mg/dL WBC/hpf: 0 - 5/hpf  Appearance: Clear Ketones: Trace mg/dL RBC/hpf: 0 - 2/hpf  Specific Gravity: 1.015 Blood: 1+ ery/uL Bacteria: Rare (0-9/hpf)  pH: 5.5 Protein: Trace mg/dL Cystals: NS (Not Seen)  Glucose: Neg mg/dL Urobilinogen: 0.2 mg/dL Casts: NS (Not Seen)    Nitrites: Neg Trichomonas: Not Present    Leukocyte Esterase: 1+ leu/uL Mucous: Present      Epithelial Cells: NS (Not Seen)      Yeast: NS (Not Seen)      Sperm: Not Present    Notes: **QNS for spun micro**    ASSESSMENT:      ICD-10 Details  1 GU:   Ureteral calculus - N20.1 Undiagnosed New Problem  2   Flank Pain - R10.84 Undiagnosed New Problem  3   Ureteral obstruction secondary to calculous - N13.2 Undiagnosed New Problem   PLAN:           Orders X-Rays: C.T. Stone Protocol Without I.V. Contrast          Schedule         Document Letter(s):  Created for Patient: Clinical Summary         Notes:   I had a long discussion with Ms. Getz. The KUB suggests a left proximal ureteral stone. I recommended moving forward with a CT scan to confirm. Plans made for patient to return to clinic Monday for her CT scan. We reviewed available treatment options for her stone. If possible, Ms  Salyards would be most interested in ESWL. We discussed possible risk include hematuria, hematoma, retained stone fragments, incomplete fragmentation, persistence of symptoms, need for future procedures and she voiced understanding. Ms. Strunk was provided the information packet for ESWL and we just need a green sheet to have her procedure scheduled pending her CT scan results.   In the meantime, we reviewed emergency department criteria for which the patient should notify our office or present to the emergency department. We discussed likely in these cases a stent could be placed.   all questions answered    E & M CODES: We spent 60 minutes dedicated to evaluation and management time, including face to face interaction, discussions on coordination of care, documentation, result review, and discussion with others as applicable.     * Signed by Cleophus Dade. Jarvis Mesa, M.D. on 09/30/23 at 11:34 AM (EDT)*      ADD: CT with left  10 mm proximal stone

## 2023-10-07 ENCOUNTER — Other Ambulatory Visit: Payer: Self-pay

## 2023-10-07 ENCOUNTER — Ambulatory Visit (HOSPITAL_COMMUNITY)
Admission: RE | Admit: 2023-10-07 | Discharge: 2023-10-07 | Disposition: A | Discharge status: Home / Self Care | Attending: Urology | Admitting: Urology

## 2023-10-07 ENCOUNTER — Ambulatory Visit (HOSPITAL_COMMUNITY)

## 2023-10-07 ENCOUNTER — Encounter (HOSPITAL_COMMUNITY)
Admission: RE | Disposition: A | Discharge status: Home / Self Care | Payer: Self-pay | Source: Home / Self Care | Attending: Urology

## 2023-10-07 ENCOUNTER — Encounter (HOSPITAL_COMMUNITY): Payer: Self-pay | Admitting: Urology

## 2023-10-07 DIAGNOSIS — N201 Calculus of ureter: Secondary | ICD-10-CM | POA: Diagnosis not present

## 2023-10-07 DIAGNOSIS — N135 Crossing vessel and stricture of ureter without hydronephrosis: Secondary | ICD-10-CM | POA: Insufficient documentation

## 2023-10-07 HISTORY — PX: EXTRACORPOREAL SHOCK WAVE LITHOTRIPSY: SHX1557

## 2023-10-07 SURGERY — LITHOTRIPSY, ESWL
Anesthesia: LOCAL | Laterality: Left

## 2023-10-07 MED ORDER — CIPROFLOXACIN HCL 500 MG PO TABS
500.0000 mg | ORAL_TABLET | ORAL | Status: AC
  Administered 2023-10-07: 500 mg via ORAL
  Filled 2023-10-07: qty 1

## 2023-10-07 MED ORDER — IBUPROFEN 200 MG PO TABS: 200.0000 mg | ORAL_TABLET | ORAL | Status: DC | PRN

## 2023-10-07 MED ORDER — DIPHENHYDRAMINE HCL 25 MG PO CAPS
25.0000 mg | ORAL_CAPSULE | ORAL | Status: AC
  Administered 2023-10-07: 25 mg via ORAL
  Filled 2023-10-07: qty 1

## 2023-10-07 MED ORDER — DIAZEPAM 5 MG PO TABS
10.0000 mg | ORAL_TABLET | ORAL | Status: AC
  Administered 2023-10-07: 10 mg via ORAL
  Filled 2023-10-07: qty 2

## 2023-10-07 MED ORDER — IBUPROFEN 200 MG PO TABS: 200.0000 mg | ORAL_TABLET | ORAL | Status: AC | PRN

## 2023-10-07 MED ORDER — SODIUM CHLORIDE 0.9 % IV SOLN
INTRAVENOUS | Status: DC
  Administered 2023-10-07: 1000 mL via INTRAVENOUS

## 2023-10-07 NOTE — Op Note (Signed)
 10mm LEFT PROXIMAL STONE  LEFT ESWL   Findings: stone targeted well. Good fragmentation and fading. She may need a staged procedure if she fails to pass the stone/stone fragments.

## 2023-10-07 NOTE — Interval H&P Note (Signed)
 History and Physical Interval Note:  10/07/2023 12:41 PM  Gloria Wise  has presented today for surgery, with the diagnosis of LEFT URETERAL STONE.  The various methods of treatment have been discussed with the patient and family. After consideration of risks, benefits and other options for treatment, the patient has consented to  Procedure(s): LITHOTRIPSY, ESWL (Left) as a surgical intervention.  The patient's history has been reviewed, patient examined, no change in status, stable for surgery.  I have reviewed the patient's chart and labs.  Questions were answered to the patient's satisfaction.  No dysuria or gross hematuria. No fever.    Christina Coyer

## 2023-10-10 ENCOUNTER — Encounter (HOSPITAL_COMMUNITY): Payer: Self-pay | Admitting: Urology

## 2023-10-21 DIAGNOSIS — N201 Calculus of ureter: Secondary | ICD-10-CM | POA: Diagnosis not present

## 2023-11-02 DIAGNOSIS — M67449 Ganglion, unspecified hand: Secondary | ICD-10-CM | POA: Diagnosis not present

## 2023-11-02 DIAGNOSIS — S40021A Contusion of right upper arm, initial encounter: Secondary | ICD-10-CM | POA: Diagnosis not present

## 2023-11-30 DIAGNOSIS — K219 Gastro-esophageal reflux disease without esophagitis: Secondary | ICD-10-CM | POA: Diagnosis not present

## 2023-11-30 DIAGNOSIS — R6889 Other general symptoms and signs: Secondary | ICD-10-CM | POA: Diagnosis not present

## 2023-11-30 DIAGNOSIS — K30 Functional dyspepsia: Secondary | ICD-10-CM | POA: Diagnosis not present

## 2023-12-28 DIAGNOSIS — N3001 Acute cystitis with hematuria: Secondary | ICD-10-CM | POA: Diagnosis not present

## 2023-12-28 DIAGNOSIS — R300 Dysuria: Secondary | ICD-10-CM | POA: Diagnosis not present

## 2024-01-05 DIAGNOSIS — M67441 Ganglion, right hand: Secondary | ICD-10-CM | POA: Diagnosis not present

## 2024-01-05 DIAGNOSIS — M19041 Primary osteoarthritis, right hand: Secondary | ICD-10-CM | POA: Diagnosis not present

## 2024-01-11 DIAGNOSIS — R319 Hematuria, unspecified: Secondary | ICD-10-CM | POA: Diagnosis not present

## 2024-01-16 DIAGNOSIS — R2231 Localized swelling, mass and lump, right upper limb: Secondary | ICD-10-CM | POA: Diagnosis not present

## 2024-01-16 DIAGNOSIS — Z4789 Encounter for other orthopedic aftercare: Secondary | ICD-10-CM | POA: Diagnosis not present

## 2024-01-20 DIAGNOSIS — M810 Age-related osteoporosis without current pathological fracture: Secondary | ICD-10-CM | POA: Diagnosis not present

## 2024-02-21 DIAGNOSIS — H43823 Vitreomacular adhesion, bilateral: Secondary | ICD-10-CM | POA: Diagnosis not present

## 2024-02-21 DIAGNOSIS — H43813 Vitreous degeneration, bilateral: Secondary | ICD-10-CM | POA: Diagnosis not present

## 2024-02-21 DIAGNOSIS — H2513 Age-related nuclear cataract, bilateral: Secondary | ICD-10-CM | POA: Diagnosis not present

## 2024-02-21 DIAGNOSIS — H04123 Dry eye syndrome of bilateral lacrimal glands: Secondary | ICD-10-CM | POA: Diagnosis not present

## 2024-02-26 IMAGING — CT CT CARDIAC CORONARY ARTERY CALCIUM SCORE
3 series · 13 of 20 positions shown, 15 images · non-contrast
Comparison: None.

CLINICAL DATA: 71-year-old white female with decreased dorsalis
pedis pulse. Former smoker.

EXAM:
CT CARDIAC CORONARY ARTERY CALCIUM SCORE
TECHNIQUE: Non-contrast imaging through the heart was performed using
prospective ECG gating. Image post processing was performed on an
independent workstation, allowing for quantitative analysis of the
heart and coronary arteries. Note that this exam targets the heart
and the chest was not imaged in its entirety.

[Series 2: calcium scoring 2.00 qr36 bestdiast 70% hrt calciu · axial · 0.42mm/px · z∈[+1623,+1679]mm · 3 of 70 slices shown]
[im 14/70  vessel]
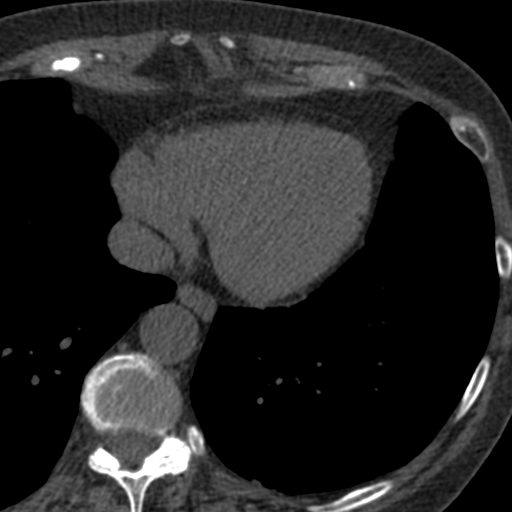
[im 28/70  vessel]
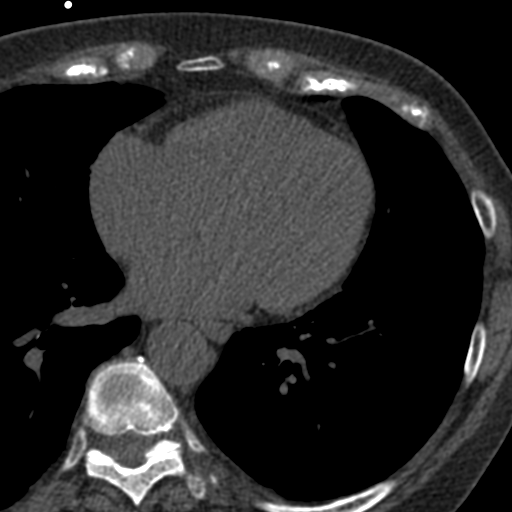
[im 42/70  vessel]
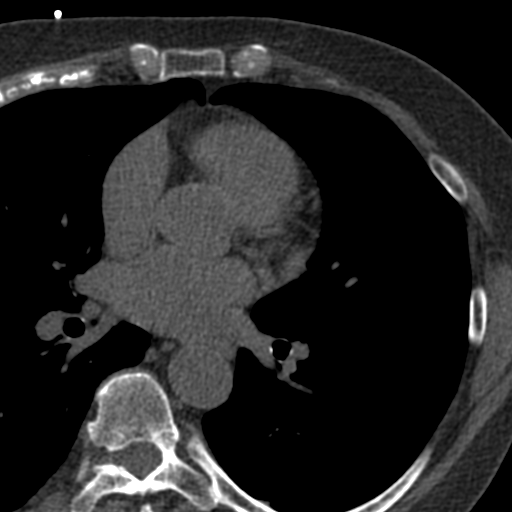

[Series 3: calcium scoring 2.00 br40 bestdiast 70% axial · axial · 0.55mm/px · z∈[+1619,+1711]mm · 5 of 70 slices shown, 7 images]
[im 12/70  vessel]
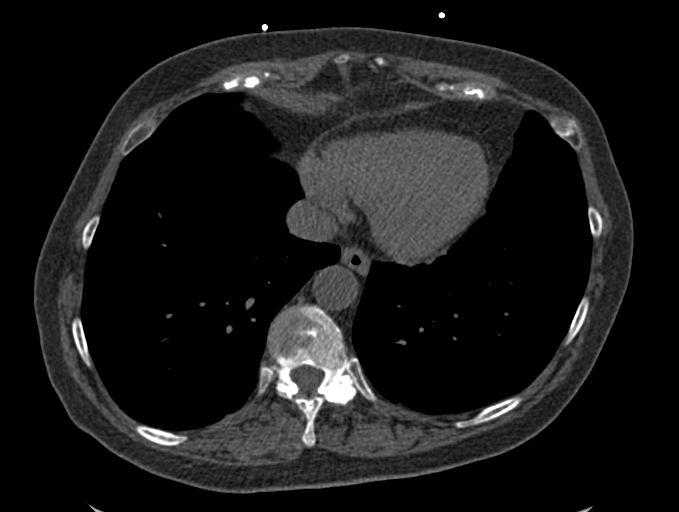
[im 12/70  lung]
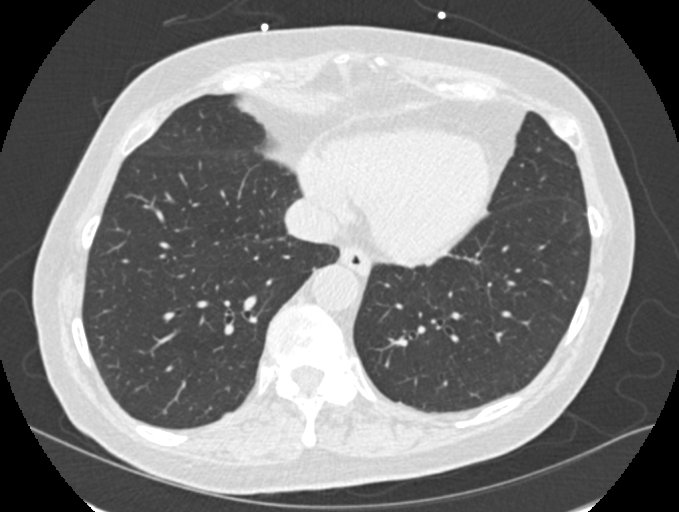
[im 24/70  vessel]
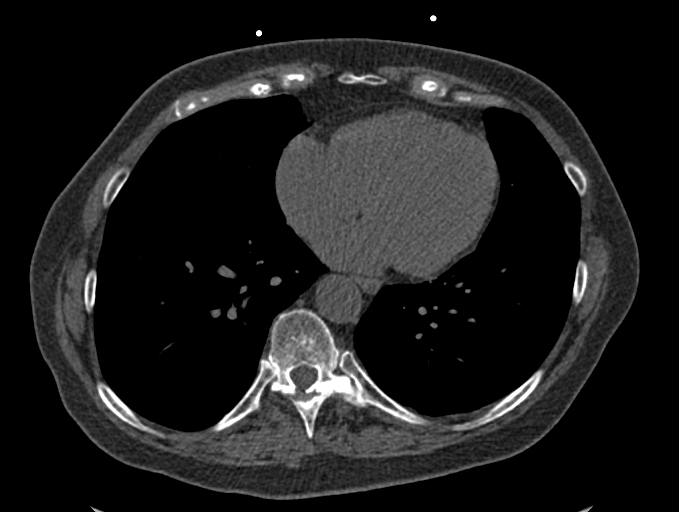
[im 35/70  vessel]
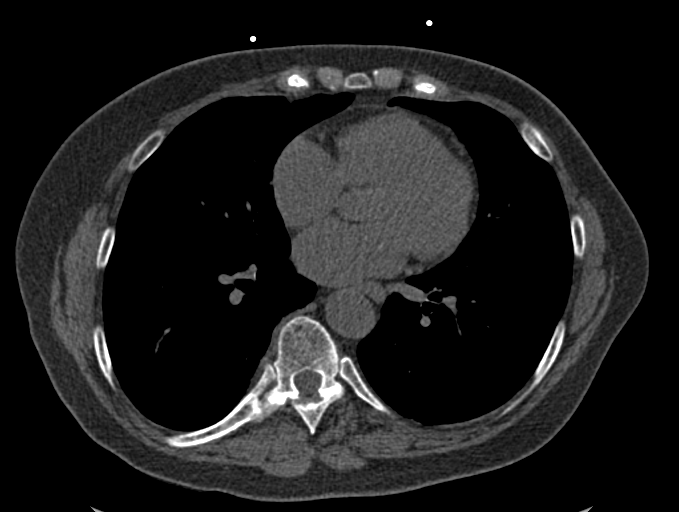
[im 47/70  vessel]
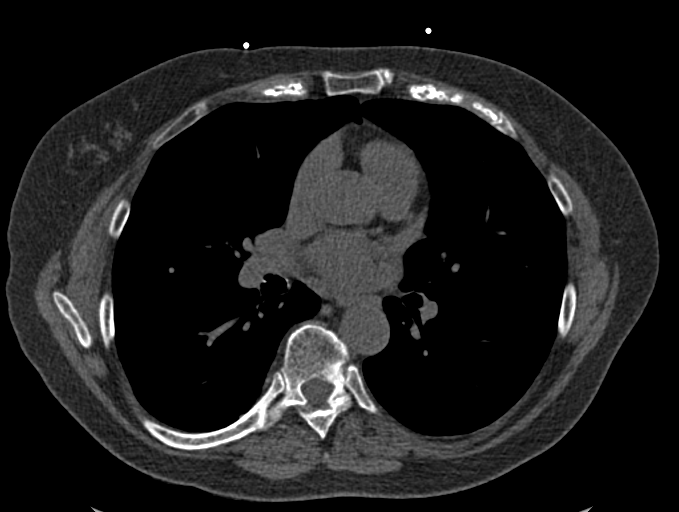
[im 58/70  vessel]
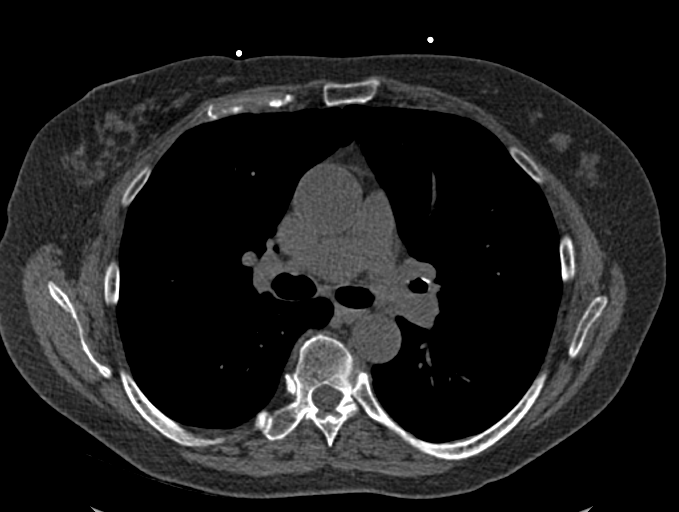
[im 58/70  lung]
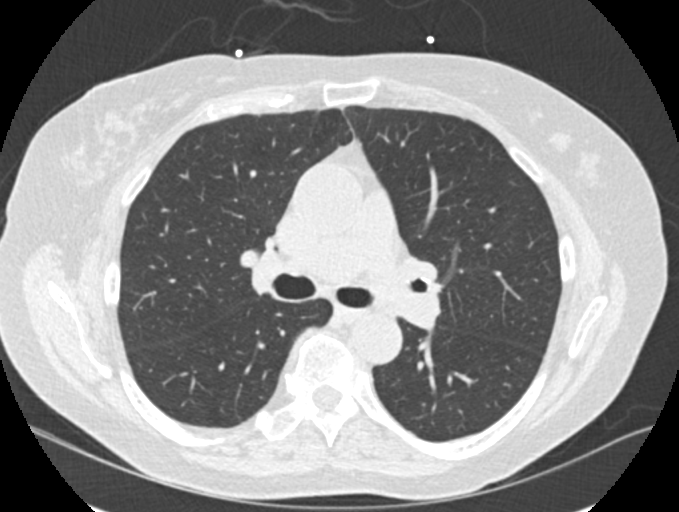

[Series 9: calcium scoring 2.00 br60 bestdiast 70% lungs · axial · 0.55mm/px · z∈[+1619,+1711]mm · 5 of 70 slices shown]
[im 12/70  vessel]
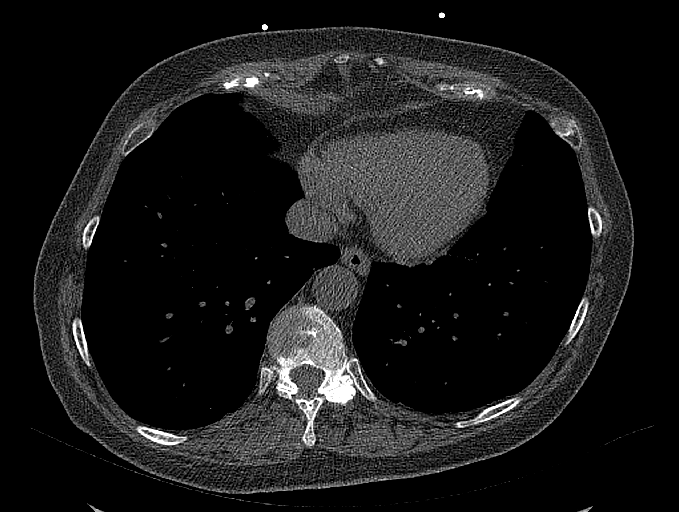
[im 24/70  vessel]
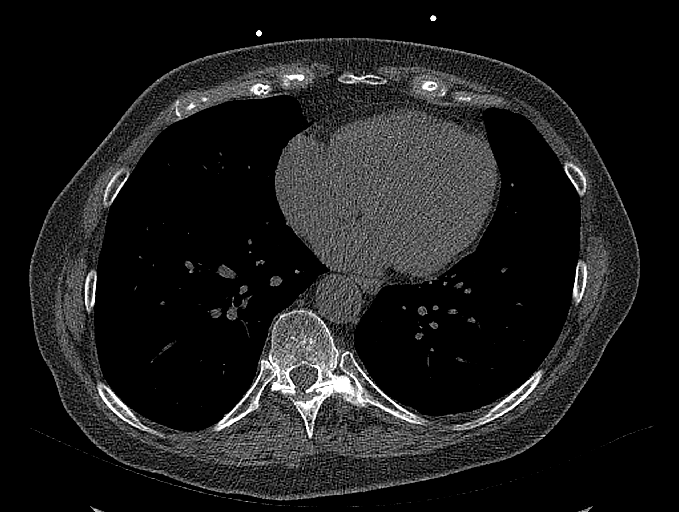
[im 35/70  vessel]
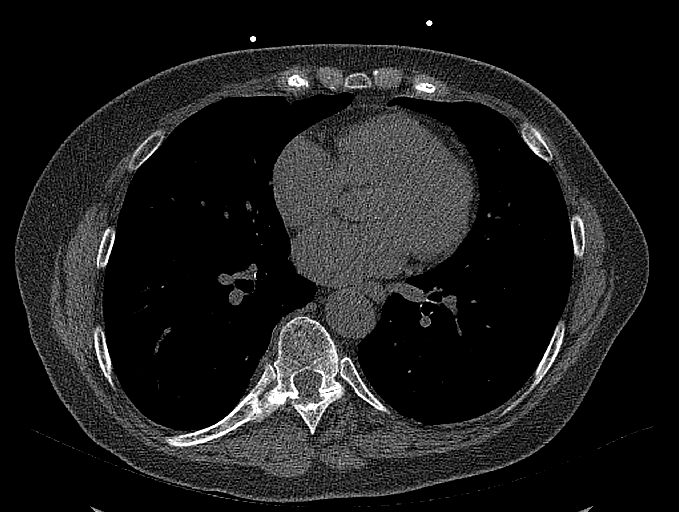
[im 47/70  vessel]
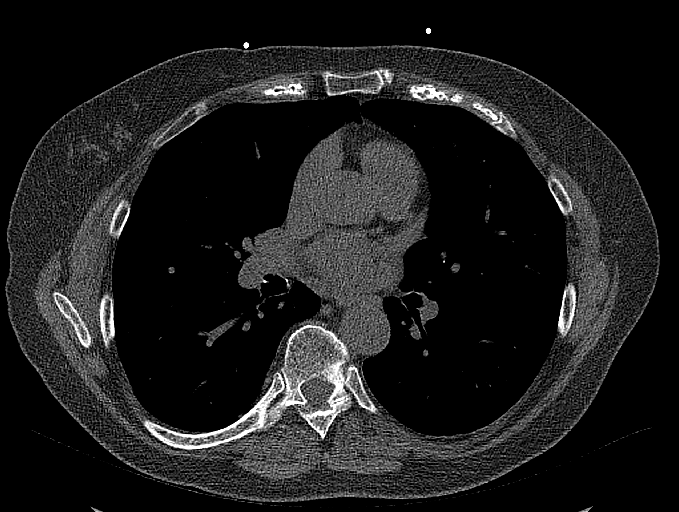
[im 58/70  vessel]
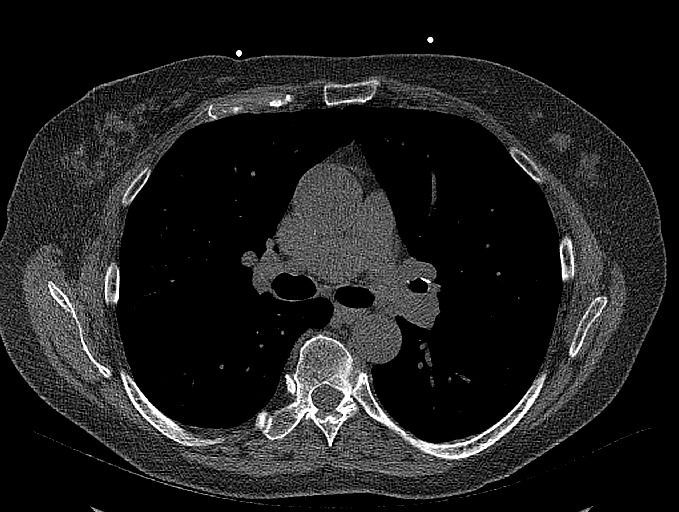

[13 of 20 positions shown; findings below may reference images not displayed]

FINDINGS: CORONARY CALCIUM SCORES:

Left Main: 0

LAD: 0

LCx: 0

RCA: 0

Total Agatston Score: 0

[HOSPITAL] percentile: 0

AORTA MEASUREMENTS:

Ascending Aorta: 38 mm

Descending Aorta: 29 mm

OTHER FINDINGS:

Heart size is normal. Visualized mediastinal structures are normal.
Images of the upper abdomen are unremarkable. 4 mm nodule in the
right middle lobe on sequence 9 image 24. Additional small
subpleural nodules are present. 3 mm nodule in the right lower lobe
on sequence 9 image 17. Additional tiny nodules in the left lung.
Curvature in the thoracic spine towards the right.
IMPRESSION: 1. Coronary calcium score is 0.
2. Several small pulmonary nodules, largest measures 4 mm in the
right middle lobe. No follow-up needed if patient is low-risk (and
has no known or suspected primary neoplasm). Non-contrast chest CT
can be considered in 12 months if patient is high-risk. This
recommendation follows the consensus statement: Guidelines for
Management of Incidental Pulmonary Nodules Detected on CT Images:
3. Scoliosis.

## 2024-02-27 DIAGNOSIS — Z1231 Encounter for screening mammogram for malignant neoplasm of breast: Secondary | ICD-10-CM | POA: Diagnosis not present

## 2024-02-28 DIAGNOSIS — L821 Other seborrheic keratosis: Secondary | ICD-10-CM | POA: Diagnosis not present

## 2024-02-28 DIAGNOSIS — D2272 Melanocytic nevi of left lower limb, including hip: Secondary | ICD-10-CM | POA: Diagnosis not present

## 2024-02-28 DIAGNOSIS — L720 Epidermal cyst: Secondary | ICD-10-CM | POA: Diagnosis not present

## 2024-02-28 DIAGNOSIS — L570 Actinic keratosis: Secondary | ICD-10-CM | POA: Diagnosis not present

## 2024-02-28 DIAGNOSIS — D2262 Melanocytic nevi of left upper limb, including shoulder: Secondary | ICD-10-CM | POA: Diagnosis not present
